# Patient Record
Sex: Female | Born: 1985 | Race: White | Hispanic: No | Marital: Married | Smoking: Current every day smoker
Health system: Southern US, Community
[De-identification: ages and names within clinical notes are randomized; demographics above are authoritative.]

## PROBLEM LIST (undated history)

## (undated) DIAGNOSIS — G43909 Migraine, unspecified, not intractable, without status migrainosus: Secondary | ICD-10-CM

## (undated) DIAGNOSIS — D693 Immune thrombocytopenic purpura: Secondary | ICD-10-CM

## (undated) DIAGNOSIS — F419 Anxiety disorder, unspecified: Secondary | ICD-10-CM

## (undated) DIAGNOSIS — T7840XA Allergy, unspecified, initial encounter: Secondary | ICD-10-CM

## (undated) DIAGNOSIS — F32A Depression, unspecified: Secondary | ICD-10-CM

## (undated) DIAGNOSIS — F329 Major depressive disorder, single episode, unspecified: Secondary | ICD-10-CM

## (undated) DIAGNOSIS — D689 Coagulation defect, unspecified: Secondary | ICD-10-CM

## (undated) DIAGNOSIS — Z87442 Personal history of urinary calculi: Secondary | ICD-10-CM

## (undated) DIAGNOSIS — K219 Gastro-esophageal reflux disease without esophagitis: Secondary | ICD-10-CM

## (undated) HISTORY — DX: Anxiety disorder, unspecified: F41.9

## (undated) HISTORY — DX: Depression, unspecified: F32.A

## (undated) HISTORY — DX: Allergy, unspecified, initial encounter: T78.40XA

## (undated) HISTORY — DX: Coagulation defect, unspecified: D68.9

## (undated) HISTORY — DX: Major depressive disorder, single episode, unspecified: F32.9

---

## 2005-09-07 ENCOUNTER — Ambulatory Visit: Payer: Self-pay | Admitting: Oncology

## 2005-09-08 ENCOUNTER — Inpatient Hospital Stay (HOSPITAL_COMMUNITY): Admission: EM | Admit: 2005-09-08 | Discharge: 2005-09-10 | Payer: Self-pay | Admitting: Oncology

## 2005-09-12 ENCOUNTER — Ambulatory Visit: Payer: Self-pay | Admitting: Oncology

## 2005-09-13 LAB — CBC WITH DIFFERENTIAL/PLATELET
BASO%: 0.2 % (ref 0.0–2.0)
EOS%: 0.2 % (ref 0.0–7.0)
Eosinophils Absolute: 0 10*3/uL (ref 0.0–0.5)
LYMPH%: 23.1 % (ref 14.0–48.0)
MONO#: 0.8 10*3/uL (ref 0.1–0.9)
MONO%: 4.2 % (ref 0.0–13.0)
NEUT%: 72.3 % (ref 39.6–76.8)
Platelets: 89 10*3/uL — ABNORMAL LOW (ref 145–400)
RBC: 4.71 10*6/uL (ref 3.70–5.32)
WBC: 18.9 10*3/uL — ABNORMAL HIGH (ref 3.9–10.0)
lymph#: 4.4 10*3/uL — ABNORMAL HIGH (ref 0.9–3.3)

## 2005-09-19 LAB — CBC WITH DIFFERENTIAL/PLATELET
EOS%: 0 % (ref 0.0–7.0)
Eosinophils Absolute: 0 10*3/uL (ref 0.0–0.5)
HCT: 43.1 % (ref 34.8–46.6)
HGB: 14.8 g/dL (ref 11.6–15.9)
MONO%: 2 % (ref 0.0–13.0)
NEUT#: 25.6 10*3/uL — ABNORMAL HIGH (ref 1.5–6.5)
Platelets: 224 10*3/uL (ref 145–400)
RBC: 4.78 10*6/uL (ref 3.70–5.32)
RDW: 11.9 % (ref 11.3–14.5)
WBC: 28.2 10*3/uL — ABNORMAL HIGH (ref 3.9–10.0)

## 2005-09-26 LAB — CBC WITH DIFFERENTIAL/PLATELET
Basophils Absolute: 0.1 10*3/uL (ref 0.0–0.1)
EOS%: 0.1 % (ref 0.0–7.0)
Eosinophils Absolute: 0 10*3/uL (ref 0.0–0.5)
HGB: 13.7 g/dL (ref 11.6–15.9)
LYMPH%: 8.6 % — ABNORMAL LOW (ref 14.0–48.0)
MONO#: 0.5 10*3/uL (ref 0.1–0.9)
MONO%: 2.1 % (ref 0.0–13.0)
WBC: 22.9 10*3/uL — ABNORMAL HIGH (ref 3.9–10.0)
lymph#: 2 10*3/uL (ref 0.9–3.3)

## 2005-10-03 LAB — URINALYSIS, MICROSCOPIC - CHCC
Bilirubin (Urine): NEGATIVE
Blood: NEGATIVE
Leukocyte Esterase: NEGATIVE
Nitrite: NEGATIVE
Protein: NEGATIVE mg/dL

## 2005-10-03 LAB — CBC WITH DIFFERENTIAL/PLATELET
Basophils Absolute: 0 10*3/uL (ref 0.0–0.1)
EOS%: 0.1 % (ref 0.0–7.0)
Eosinophils Absolute: 0 10*3/uL (ref 0.0–0.5)
HCT: 38.7 % (ref 34.8–46.6)
LYMPH%: 9 % — ABNORMAL LOW (ref 14.0–48.0)
NEUT#: 10.8 10*3/uL — ABNORMAL HIGH (ref 1.5–6.5)
RBC: 4.27 10*6/uL (ref 3.70–5.32)
WBC: 12 10*3/uL — ABNORMAL HIGH (ref 3.9–10.0)

## 2005-10-26 ENCOUNTER — Ambulatory Visit: Payer: Self-pay | Admitting: Oncology

## 2005-11-13 LAB — CBC WITH DIFFERENTIAL/PLATELET
BASO%: 0.8 % (ref 0.0–2.0)
Basophils Absolute: 0.1 10*3/uL (ref 0.0–0.1)
Eosinophils Absolute: 0.1 10*3/uL (ref 0.0–0.5)
HCT: 39 % (ref 34.8–46.6)
HGB: 13.6 g/dL (ref 11.6–15.9)
MCV: 91.3 fL (ref 81.0–101.0)
MONO%: 7.5 % (ref 0.0–13.0)
NEUT%: 58.2 % (ref 39.6–76.8)
RBC: 4.27 10*6/uL (ref 3.70–5.32)
WBC: 10.7 10*3/uL — ABNORMAL HIGH (ref 3.9–10.0)
lymph#: 3.5 10*3/uL — ABNORMAL HIGH (ref 0.9–3.3)

## 2005-11-27 LAB — CBC WITH DIFFERENTIAL/PLATELET
Basophils Absolute: 0.1 10*3/uL (ref 0.0–0.1)
EOS%: 0.4 % (ref 0.0–7.0)
Eosinophils Absolute: 0 10*3/uL (ref 0.0–0.5)
HGB: 14.1 g/dL (ref 11.6–15.9)
LYMPH%: 13.5 % — ABNORMAL LOW (ref 14.0–48.0)
MONO%: 3.8 % (ref 0.0–13.0)
WBC: 12.6 10*3/uL — ABNORMAL HIGH (ref 3.9–10.0)

## 2005-12-22 ENCOUNTER — Ambulatory Visit: Payer: Self-pay | Admitting: Oncology

## 2005-12-26 LAB — CBC WITH DIFFERENTIAL/PLATELET
BASO%: 1.2 % (ref 0.0–2.0)
Basophils Absolute: 0.1 10*3/uL (ref 0.0–0.1)
EOS%: 1.8 % (ref 0.0–7.0)
HCT: 40.1 % (ref 34.8–46.6)
HGB: 13.7 g/dL (ref 11.6–15.9)
LYMPH%: 28.7 % (ref 14.0–48.0)
MCH: 31.2 pg (ref 26.0–34.0)
MONO%: 7.4 % (ref 0.0–13.0)
NEUT#: 4.7 10*3/uL (ref 1.5–6.5)
Platelets: 334 10*3/uL (ref 145–400)

## 2006-01-22 LAB — CBC WITH DIFFERENTIAL/PLATELET
BASO%: 1 % (ref 0.0–2.0)
Basophils Absolute: 0.1 10*3/uL (ref 0.0–0.1)
EOS%: 3.6 % (ref 0.0–7.0)
Eosinophils Absolute: 0.2 10*3/uL (ref 0.0–0.5)
HCT: 39.7 % (ref 34.8–46.6)
HGB: 13.5 g/dL (ref 11.6–15.9)
LYMPH%: 34.2 % (ref 14.0–48.0)
MCH: 30.9 pg (ref 26.0–34.0)
MCHC: 34.1 g/dL (ref 32.0–36.0)
MCV: 90.6 fL (ref 81.0–101.0)
MONO#: 0.5 10*3/uL (ref 0.1–0.9)
MONO%: 9.2 % (ref 0.0–13.0)
NEUT#: 3 10*3/uL (ref 1.5–6.5)
NEUT%: 52 % (ref 39.6–76.8)
Platelets: 246 10*3/uL (ref 145–400)
RBC: 4.38 10*6/uL (ref 3.70–5.32)
RDW: 10.8 % — ABNORMAL LOW (ref 11.3–14.5)
WBC: 5.8 10*3/uL (ref 3.9–10.0)
lymph#: 2 10*3/uL (ref 0.9–3.3)

## 2006-02-14 ENCOUNTER — Ambulatory Visit: Payer: Self-pay | Admitting: Oncology

## 2006-02-19 LAB — CBC WITH DIFFERENTIAL/PLATELET
Eosinophils Absolute: 0 10*3/uL (ref 0.0–0.5)
HCT: 40.8 % (ref 34.8–46.6)
MCH: 31.8 pg (ref 26.0–34.0)
MONO#: 0.4 10*3/uL (ref 0.1–0.9)
MONO%: 4.4 % (ref 0.0–13.0)
NEUT#: 7.1 10*3/uL — ABNORMAL HIGH (ref 1.5–6.5)
Platelets: 236 10*3/uL (ref 145–400)
RBC: 4.51 10*6/uL (ref 3.70–5.32)

## 2006-05-17 ENCOUNTER — Ambulatory Visit: Payer: Self-pay | Admitting: Oncology

## 2006-05-21 LAB — CBC WITH DIFFERENTIAL/PLATELET
Basophils Absolute: 0.1 10*3/uL (ref 0.0–0.1)
EOS%: 1.6 % (ref 0.0–7.0)
MCHC: 34.6 g/dL (ref 32.0–36.0)
MCV: 90.8 fL (ref 81.0–101.0)
MONO#: 0.6 10*3/uL (ref 0.1–0.9)
Platelets: 202 10*3/uL (ref 145–400)
RBC: 4.18 10*6/uL (ref 3.70–5.32)
RDW: 10.9 % — ABNORMAL LOW (ref 11.3–14.5)
lymph#: 1.9 10*3/uL (ref 0.9–3.3)

## 2006-08-16 ENCOUNTER — Ambulatory Visit: Payer: Self-pay | Admitting: Oncology

## 2006-08-20 LAB — CBC WITH DIFFERENTIAL/PLATELET
Basophils Absolute: 0 10*3/uL (ref 0.0–0.1)
HCT: 35.3 % (ref 34.8–46.6)
MCH: 31.7 pg (ref 26.0–34.0)
MCHC: 35.3 g/dL (ref 32.0–36.0)
MCV: 89.9 fL (ref 81.0–101.0)
MONO#: 0.4 10*3/uL (ref 0.1–0.9)
NEUT%: 70.9 % (ref 39.6–76.8)
RBC: 3.93 10*6/uL (ref 3.70–5.32)

## 2007-01-17 ENCOUNTER — Ambulatory Visit: Payer: Self-pay | Admitting: Oncology

## 2007-01-21 LAB — CBC WITH DIFFERENTIAL/PLATELET
EOS%: 1 % (ref 0.0–7.0)
Eosinophils Absolute: 0.1 10*3/uL (ref 0.0–0.5)
MCH: 31.4 pg (ref 26.0–34.0)
MCHC: 34.8 g/dL (ref 32.0–36.0)
MONO#: 0.4 10*3/uL (ref 0.1–0.9)
NEUT#: 3.1 10*3/uL (ref 1.5–6.5)
NEUT%: 58.4 % (ref 39.6–76.8)
Platelets: 96 10*3/uL — ABNORMAL LOW (ref 145–400)
WBC: 5.4 10*3/uL (ref 3.9–10.0)
lymph#: 1.8 10*3/uL (ref 0.9–3.3)

## 2007-02-21 LAB — CBC WITH DIFFERENTIAL/PLATELET
BASO%: 1.2 % (ref 0.0–2.0)
EOS%: 1.3 % (ref 0.0–7.0)
Eosinophils Absolute: 0.1 10*3/uL (ref 0.0–0.5)
HCT: 37.6 % (ref 34.8–46.6)
HGB: 13.2 g/dL (ref 11.6–15.9)
Platelets: 158 10*3/uL (ref 145–400)
RBC: 4.24 10*6/uL (ref 3.70–5.32)
RDW: 9.9 % — ABNORMAL LOW (ref 11.3–14.5)
WBC: 5 10*3/uL (ref 3.9–10.0)
lymph#: 1.6 10*3/uL (ref 0.9–3.3)

## 2007-04-17 ENCOUNTER — Ambulatory Visit: Payer: Self-pay | Admitting: Oncology

## 2007-04-22 LAB — CBC WITH DIFFERENTIAL/PLATELET
BASO%: 1 % (ref 0.0–2.0)
Basophils Absolute: 0.1 10*3/uL (ref 0.0–0.1)
EOS%: 0.2 % (ref 0.0–7.0)
HCT: 39.2 % (ref 34.8–46.6)
LYMPH%: 26.2 % (ref 14.0–48.0)
MCH: 31.8 pg (ref 26.0–34.0)
MONO#: 0.5 10*3/uL (ref 0.1–0.9)
MONO%: 7.3 % (ref 0.0–13.0)
RBC: 4.31 10*6/uL (ref 3.70–5.32)
WBC: 7.4 10*3/uL (ref 3.9–10.0)
lymph#: 1.9 10*3/uL (ref 0.9–3.3)

## 2007-07-18 ENCOUNTER — Ambulatory Visit: Payer: Self-pay | Admitting: Oncology

## 2007-07-22 LAB — CBC WITH DIFFERENTIAL/PLATELET
BASO%: 0.4 % (ref 0.0–2.0)
Basophils Absolute: 0 10*3/uL (ref 0.0–0.1)
HCT: 36.7 % (ref 34.8–46.6)
HGB: 12.7 g/dL (ref 11.6–15.9)
LYMPH%: 19.4 % (ref 14.0–48.0)
MCV: 89.5 fL (ref 81.0–101.0)
MONO#: 0.5 10*3/uL (ref 0.1–0.9)
NEUT#: 6.6 10*3/uL — ABNORMAL HIGH (ref 1.5–6.5)
NEUT%: 73.1 % (ref 39.6–76.8)
RBC: 4.1 10*6/uL (ref 3.70–5.32)
RDW: 12.5 % (ref 11.3–14.5)
WBC: 9 10*3/uL (ref 3.9–10.0)

## 2007-10-18 ENCOUNTER — Ambulatory Visit: Payer: Self-pay | Admitting: Oncology

## 2007-10-22 LAB — CBC WITH DIFFERENTIAL/PLATELET
EOS%: 1.4 % (ref 0.0–7.0)
HCT: 41.2 % (ref 34.8–46.6)
HGB: 13.5 g/dL (ref 11.6–15.9)
MCH: 29.9 pg (ref 26.0–34.0)
MONO%: 7.9 % (ref 0.0–13.0)
RDW: 12.3 % (ref 11.3–14.5)
WBC: 6.8 10*3/uL (ref 3.9–10.0)

## 2008-04-17 ENCOUNTER — Ambulatory Visit: Payer: Self-pay | Admitting: Oncology

## 2008-04-21 LAB — CBC WITH DIFFERENTIAL/PLATELET
BASO%: 0.4 % (ref 0.0–2.0)
Basophils Absolute: 0 10*3/uL (ref 0.0–0.1)
Eosinophils Absolute: 0.1 10*3/uL (ref 0.0–0.5)
HCT: 38.4 % (ref 34.8–46.6)
HGB: 13.1 g/dL (ref 11.6–15.9)
LYMPH%: 21.7 % (ref 14.0–49.7)
MCV: 89.7 fL (ref 79.5–101.0)
MONO#: 0.6 10*3/uL (ref 0.1–0.9)
MONO%: 8.5 % (ref 0.0–14.0)
NEUT#: 5.1 10*3/uL (ref 1.5–6.5)
NEUT%: 67.8 % (ref 38.4–76.8)
WBC: 7.5 10*3/uL (ref 3.9–10.3)

## 2008-10-12 ENCOUNTER — Emergency Department (HOSPITAL_COMMUNITY): Admission: EM | Admit: 2008-10-12 | Discharge: 2008-10-12 | Payer: Self-pay | Admitting: Emergency Medicine

## 2008-10-16 ENCOUNTER — Ambulatory Visit: Payer: Self-pay | Admitting: Oncology

## 2008-10-27 LAB — CBC WITH DIFFERENTIAL/PLATELET
BASO%: 0.7 % (ref 0.0–2.0)
Eosinophils Absolute: 0.1 10*3/uL (ref 0.0–0.5)
MCH: 30.3 pg (ref 25.1–34.0)
MCHC: 34.1 g/dL (ref 31.5–36.0)
MCV: 88.8 fL (ref 79.5–101.0)
MONO#: 0.7 10*3/uL (ref 0.1–0.9)
NEUT%: 55.2 % (ref 38.4–76.8)

## 2009-02-09 ENCOUNTER — Ambulatory Visit: Payer: Self-pay | Admitting: Oncology

## 2009-02-09 LAB — CBC WITH DIFFERENTIAL/PLATELET
BASO%: 0.5 % (ref 0.0–2.0)
Basophils Absolute: 0 10*3/uL (ref 0.0–0.1)
HCT: 39.4 % (ref 34.8–46.6)
HGB: 13.2 g/dL (ref 11.6–15.9)
MCHC: 33.6 g/dL (ref 31.5–36.0)
MONO#: 0.5 10*3/uL (ref 0.1–0.9)
Platelets: 237 10*3/uL (ref 145–400)

## 2009-03-22 ENCOUNTER — Emergency Department (HOSPITAL_COMMUNITY): Admission: EM | Admit: 2009-03-22 | Discharge: 2009-03-22 | Payer: Self-pay | Admitting: Emergency Medicine

## 2009-04-16 ENCOUNTER — Ambulatory Visit: Payer: Self-pay | Admitting: Oncology

## 2010-04-03 LAB — URINALYSIS, ROUTINE W REFLEX MICROSCOPIC
Bilirubin Urine: NEGATIVE
Glucose, UA: NEGATIVE mg/dL
Nitrite: NEGATIVE
Protein, ur: 30 mg/dL — AB
Specific Gravity, Urine: 1.027 (ref 1.005–1.030)
pH: 6 (ref 5.0–8.0)

## 2010-04-03 LAB — URINE MICROSCOPIC-ADD ON

## 2010-04-03 LAB — POCT PREGNANCY, URINE: Preg Test, Ur: NEGATIVE

## 2010-05-27 NOTE — Discharge Summary (Signed)
NAME:  Misty Bauer, FITZWATER NO.:  [id number]   MEDICAL RECORD NO.:  [id number]          PATIENT TYPE:  INP   LOCATION:  1305                         FACILITY:  [facility]   PHYSICIAN:  Leighton Roach. Truett Perna, M.D. DATE OF BIRTH:  17-Mar-1985   DATE OF ADMISSION:  09/08/2005  DATE OF DISCHARGE:  09/10/2005                                 DISCHARGE SUMMARY   DISCHARGE DIAGNOSES:  1. 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  2. Ecchymoses, stable.   CONDITION ON DISCHARGE:  Stable.   CONSULTATIONS:  None.   PROCEDURE:  None.   HISTORY OF PRESENT ILLNESS:  [name] was admitted to the hospital with  increased bruising over the past four months.  She had noted gum bleeding  when she brushed her teeth and had noted a few nosebleeds.  The nosebleeds  had stopped with pressure.  She also reports sporadic rectal bleeding over  the past year following bowel movements.  She was diagnosed with a torn  sphincter by a physician in [place].  She does not have any consistent  rectal bleeding.  The patient did present to the [employer] [facility] earlier on the day of admission.  A CBC was remarkable for a  platelet count of less than 10,000.  A repeat CBC later in the day again  revealed severe thrombocytopenia with a platelet count of 2,000.  The  patient was sent to the [facility] emergency room for further evaluation.  The patient was admitted for further management of her thrombocytopenia.   HOSPITAL COURSE:  The patient was noted to have a platelet count of 6,000 on  admission.  The patient was started on intravenous Solu-Medrol. She was not  noted to have any evidence of bleeding on admission.  On the morning of  September 08, 2005 her platelet count was noted to be 5,000 and intravenous  Solu-Medrol was continued.  Again, she was not noted to have any further  bleeding.  On the morning of September 09, 2005 her platelet count did  increase to 13,000.   There was no bleeding.  The steroids were continued.  No new bruising.  Her platelet count did increase to 28,000 on the morning  of September 10, 2005.  There was no bleeding at that time.  No new bruising.  Her vital signs were stable.  Her physical examination was unremarkable.  The patient was seen by Dr. Drue Second  on that date and felt to be stable  for discharge.   DISCHARGE MEDICATIONS:  1. Decadron 40 mg daily.  2. Ortho-Novum as at home.  3. Protonix 40 mg daily.  4. Xanax 1 mg at bedtime as needed for sleep.   DISCHARGE INSTRUCTIONS:  The patient is to followup with Dr. Mancel Bale.  She is to call on September 12, 2005 at [phone] to schedule that  appointment.  The patient was also given a prescription for CBC with diff to  be done at the cancer center on September 12, 2005.  She will call the  morning of September 12, 2005 to schedule that  appointment.  The  patient has no restrictions on diet.  She is to increase  her activity slowly.  The patient is to followup with Dr. Truett Perna before  she returns to school.  She is to call [phone] if she has any bleeding or  increased bruising in the interim.  The patient states understanding of all  the instructions.      Myrtis Ser, NP    ______________________________  Leighton Roach Truett Perna, M.D.    KRC/MEDQ  D:  09/10/2005  T:  09/11/2005  Job:  [id number]

## 2010-05-27 NOTE — Consult Note (Signed)
NAME:  Misty, Bauer NO.:  [id number]   MEDICAL RECORD NO.:  [id number]          PATIENT TYPE:  EMS   LOCATION:  MAJO                         FACILITY:  [facility]   PHYSICIAN:  Leighton Roach. Truett Perna, M.D. DATE OF BIRTH:  05-05-1985   DATE OF CONSULTATION:  DATE OF DISCHARGE:                                   CONSULTATION   HEMATOLOGY CONSULTATION   PATIENT IDENTIFICATION:  Misty Bauer is a 25 year old admitted for  evaluation and treatment of severe thrombocytopenia.   HISTORY OF PRESENT ILLNESS:  Misty Bauer reports increased bruising for  approximately the past four months. She has noted gum bleeding when she  brushes her teeth, and she has had a few nosebleeds. The nose bleeds have  stopped with pressure. She reports sporadic rectal bleeding over the past  year following bowel movements. She was diagnosed with a torn sphincter by  a physician in [place]. She does not have consistent rectal bleeding.   She presented to the [facility] earlier today. A CBC was  remarkable for platelet count of less than 10,000. A repeat CBC later in the  day again revealed severe thrombocytopenia with a platelet count 2000. She  was referred to the [facility] emergency room for further evaluation.   PAST MEDICAL HISTORY:  1. Torn rectal sphincter.  2. Kidney stone in April 2007.  3. Iliotibial band syndrome at the left leg for the past few years.  4. Upper respiratory infection requiring hospital admission in [place]      in 2003.   PAST SURGICAL HISTORY:  None.   ALLERGIES:  NO KNOWN DRUG ALLERGIES.   CURRENT MEDICATIONS:  1. Multivitamin.  2. Naprosyn.  3. Tylenol.  4. Ortho-Novum 7/7/7.   FAMILY HISTORY:  Her maternal grandmother has arthritis.   SOCIAL HISTORY:  She lives with her father in [place]. She works in [employer]. She is a Consulting civil engineer at [employer]. She smokes cigarettes. She drinks  alcohol occasionally. She is not a consistent alcohol user.  She has no  transfusion history. She denies risk factors for HIV and hepatitis.   REVIEW OF SYSTEMS:  No fever. She reports malaise. RESPIRATORY:  Negative.  CARDIAC:  Negative. GENITOURINARY:  Negative. GASTROINTESTINAL:  Negative  aside from the intermittent bleeding with bowel movements. MUSCULOSKELETAL:  She has chronic pain at the left knee (for years). She fell recently and  developed a knot at the left parietal skull. NEUROLOGIC:  She has chronic  headaches.   INFECTIOUS DISEASE:  No recent infection.   PHYSICAL EXAMINATION:  VITAL SIGNS:  Blood pressure 133/90, pulse 92,  temperature 99.2, oxygen saturation 100% on room air. HEENT:  Oropharynx  without thrush, ulcers or bleeding. LUNGS:  Clear. No respiratory distress.  CARDIAC:  Regular rhythm. ABDOMEN:  No hepatosplenomegaly, nontender.  EXTREMITIES:  No edema. LYMPH NODES:  No palpable cervical, clavicular,  axillary, or inguinal lymph nodes. SKIN:  There are scattered ecchymoses  over the extremities. No petechiae. There is an ecchymosis (?small hematoma)  over the scalp posterior to the left ear. NEUROLOGIC:  She is alert and  oriented. Motor exam  appears grossly intact.   LABORATORY DATA:  Hemoglobin 12.5, platelets 6000, white count 9.8, ANC 7,  absolute lymphocyte count 2.1. Labs from [facility] on August 30, BUN 14,  creatinine 0.68, bilirubin 0.4, alkaline phosphatase 61. PT 13.8, PTT 30.  TSH 0.608.   Review of peripheral blood smear:  The platelets are markedly decreased in  number. There are occasional large/giant platelet forms. The majority of the  white cells are mature-appearing neutrophils and lymphocytes. There are a  few band forms. I saw a few 5-lobed neutrophils. The red cell morphology is  generally unremarkable. I saw a rare teardrop form.   IMPRESSION:  1. Severe thrombocytopenia.  2. Multiple ecchymoses secondary to #1.  3. History of a kidney stone April 2007.  4. Chronic left knee pain.   Ms.  Misty Bauer presents with spontaneous bruising secondary to severe  thrombocytopenia. The thrombocytopenia is most likely related to ITP. There  is no clinical evidence for an associated condition such as a hematopoietic  malignancy, infection or collagen vascular disease.   PLAN:  1. Admit for observation and initiation of steroid therapy.  2. Check ANA, rheumatoid factor, HIV, and vitamin B12 level.   She will be admitted to [facility].           ______________________________  Leighton Roach. Truett Perna, M.D.     GBS/MEDQ  D:  09/07/2005  T:  09/08/2005  Job:  [id number]

## 2012-01-10 HISTORY — PX: WISDOM TOOTH EXTRACTION: SHX21

## 2014-05-20 ENCOUNTER — Encounter (HOSPITAL_COMMUNITY): Payer: Self-pay | Admitting: *Deleted

## 2014-05-20 ENCOUNTER — Emergency Department (INDEPENDENT_AMBULATORY_CARE_PROVIDER_SITE_OTHER)
Admission: EM | Admit: 2014-05-20 | Discharge: 2014-05-20 | Disposition: A | Discharge status: Home / Self Care | Payer: 59 | Source: Home / Self Care | Attending: Emergency Medicine | Admitting: Emergency Medicine

## 2014-05-20 DIAGNOSIS — K219 Gastro-esophageal reflux disease without esophagitis: Secondary | ICD-10-CM | POA: Diagnosis not present

## 2014-05-20 HISTORY — DX: Gastro-esophageal reflux disease without esophagitis: K21.9

## 2014-05-20 HISTORY — DX: Immune thrombocytopenic purpura: D69.3

## 2014-05-20 HISTORY — DX: Migraine, unspecified, not intractable, without status migrainosus: G43.909

## 2014-05-20 MED ORDER — SUCRALFATE 1 GM/10ML PO SUSP: 1.0000 g | Freq: Three times a day (TID) | ORAL | Status: DC

## 2014-05-20 MED ORDER — PANTOPRAZOLE SODIUM 40 MG PO TBEC: 40.0000 mg | DELAYED_RELEASE_TABLET | Freq: Every day | ORAL | Status: DC

## 2014-05-20 NOTE — ED Notes (Signed)
Pt  Reports  Symptoms  Of  Cough    sorethroat           Coughing   Up  Blood  This  Am         History  Of  GERD       Pt  Reports  As  Well    That  [facility]     And  Had  A  Rapid  Strep  Done  Which  She  Reports  Was  Negative    -  Pt  Ambulated  To  Room   With a  Steady  Fluid gait       Pt is  Awake  And  Alert  And  Oriented  She  Is  Sitting  Upright on the  Exam table  Speaking in  Complete  sentances

## 2014-05-20 NOTE — Discharge Instructions (Signed)
Your symptoms are likely coming from uncontrolled heartburn. Take Protonix daily for the next 3 weeks. After that you can gradually stopped taking it. Take Carafate 4 times a day for the next 2 weeks. If you start having bloody vomit, dark and tarry stools or bright red blood in your stool, or develops severe abdominal pain, please go to the emergency room.

## 2014-05-20 NOTE — ED Provider Notes (Signed)
CSN: [id number]     Arrival date & time 05/20/14  [id number] History   First MD Initiated Contact with Patient 05/20/14 1013     Chief Complaint  Patient presents with  . Cough   (Consider location/radiation/quality/duration/timing/severity/associated sxs/prior Treatment) HPI  She is a 29 year old woman here for evaluation of cough. [facility]. She woke up this morning with a sore throat. She coughed up some pink tinged mucus, then bright red bloody mucus, and now back to pink tinged mucus. She reports some mild rhinorrhea. She does have a cough. This is worse at night and in the morning. She does report epigastric burning pain that will radiate into her chest. She does have a history of reflux, but has not been on any medication for several years. She states for the last 5-6 months her heartburn has been getting worse. A rapid strep was negative at [facility].  She denies any fevers, vomiting, diarrhea, melena, blood in stool.  Past Medical History  Diagnosis Date  . GERD (gastroesophageal reflux disease)   . ITP (idiopathic thrombocytopenic purpura)   . Migraine    History reviewed. No pertinent past surgical history. History reviewed. No pertinent family history. History  Substance Use Topics  . Smoking status: Current Every Day Smoker  . Smokeless tobacco: Not on file  . Alcohol Use: Yes   OB History    No data available     Review of Systems As in history of present illness Allergies  Review of patient's allergies indicates no known allergies.  Home Medications   Prior to Admission medications   Medication Sig Start Date End Date Taking? Authorizing Provider  fluticasone (FLONASE) 50 MCG/ACT nasal spray Place into both nostrils daily.   Yes Historical Provider, MD  pantoprazole (PROTONIX) 40 MG tablet Take 1 tablet (40 mg total) by mouth daily. 05/20/14   Charm RingsErin J Maddy Graham, MD  sucralfate (CARAFATE) 1 GM/10ML suspension Take 10 mLs (1 g total) by mouth 4  (four) times daily -  with meals and at bedtime. For 2 weeks. 05/20/14   Charm RingsErin J Haevyn Ury, MD   BP 134/78 mmHg  Pulse 72  Temp(Src) 98.6 F (37 C) (Oral)  Resp 14  SpO2 100% Physical Exam  Constitutional: She is oriented to person, place, and time. She appears well-developed and well-nourished. No distress.  HENT:  Nose: Nose normal.  Mouth/Throat: Oropharynx is clear and moist. No oropharyngeal exudate.  Neck: Neck supple.  Cardiovascular: Normal rate, regular rhythm and normal heart sounds.   No murmur heard. Pulmonary/Chest: Effort normal and breath sounds normal. No respiratory distress. She has no wheezes. She has no rales.  Abdominal: Soft. Bowel sounds are normal. She exhibits no distension. There is tenderness (epigastric). There is no rebound and no guarding.  Lymphadenopathy:    She has no cervical adenopathy.  Neurological: She is alert and oriented to person, place, and time.    ED Course  Procedures (including critical care time) Labs Review Labs Reviewed - No data to display  Imaging Review No results found.   MDM   1. Gastroesophageal reflux disease, esophagitis presence not specified    Symptoms are likely related to uncontrolled reflux. We'll start protonix and Carafate. Return precautions reviewed as in after visit summary.   Charm RingsErin J Shady Padron, MD 05/20/14 1101

## 2014-08-05 ENCOUNTER — Ambulatory Visit (INDEPENDENT_AMBULATORY_CARE_PROVIDER_SITE_OTHER): Payer: 59 | Admitting: Emergency Medicine

## 2014-08-05 VITALS — BP 118/82 | HR 72 | Temp 98.4°F | Resp 18 | Ht 64.0 in | Wt 94.6 lb

## 2014-08-05 DIAGNOSIS — G43009 Migraine without aura, not intractable, without status migrainosus: Secondary | ICD-10-CM | POA: Diagnosis not present

## 2014-08-05 MED ORDER — KETOROLAC TROMETHAMINE 30 MG/ML IJ SOLN
30.0000 mg | Freq: Once | INTRAMUSCULAR | Status: AC
  Administered 2014-08-05: 30 mg via INTRAMUSCULAR

## 2014-08-05 MED ORDER — BUTALBITAL-ASPIRIN-CAFFEINE 50-325-40 MG PO CAPS: 1.0000 | ORAL_CAPSULE | Freq: Four times a day (QID) | ORAL | Status: DC | PRN

## 2014-08-05 NOTE — Progress Notes (Signed)
This chart was scribed for Misty Queen, MD by Leandra Kern, Medical Scribe. This patient was seen in Room 12 and the patient's care was started at 1:30 PM.   Chief Complaint:  Chief Complaint  Patient presents with  . Migraine    x monday  . Depression    triage screening    HPI: Misty Bauer is a 29 y.o. female who reports to [facility] today complaining of severe migraines, onset 2 days ago.  Pt notes that she presents with symptoms of headache, tension in the neck, and photophobia. She reports that she usually takes protonix for the symptoms, however she states the medication has not been giving her much relief for the past few episodes. Pt reports that her migraines episodes have reduced in frequency over the last two years after taking her wisdom teeth out. She reports that she now gets them about once or twice a month. Pt notes that she has taken shots previously. Pt notes that she just got off birth control pills for hopes of becoming pregnant. Pt endorses that she is not pregnant, her last menstrual period was 2 weeks ago.   Pt reports that her depression symptoms have been going on for a couple of years, she notes that her PCP did put her on an anti-depressant medication which she was compliant with for a while, however she states that she started experiencing side effects therefore she stopped taking it. She notes no current concerns about it.  Pt works at [employer].   Past Medical History  Diagnosis Date  . GERD (gastroesophageal reflux disease)   . ITP (idiopathic thrombocytopenic purpura)   . Migraine   . Allergy   . Anxiety   . Clotting disorder   . Depression    History reviewed. No pertinent past surgical history. History   Social History  . Marital Status: Single    Spouse Name: N/A  . Number of Children: N/A  . Years of Education: N/A   Social History Main Topics  . Smoking status: Current Every Day Smoker  . Smokeless tobacco: Not on file  .  Alcohol Use: Yes  . Drug Use: Not on file  . Sexual Activity: Not on file   Other Topics Concern  . None   Social History Narrative   Family History  Problem Relation Age of Onset  . Hyperlipidemia Maternal Grandmother    Allergies  Allergen Reactions  . Penicillins Hives   Prior to Admission medications   Medication Sig Start Date End Date Taking? Authorizing Provider  fluticasone (FLONASE) 50 MCG/ACT nasal spray Place into both nostrils daily.   Yes Historical Provider, MD  norethindrone-ethinyl estradiol 1/35 (ORTHO-NOVUM, NORTREL,CYCLAFEM) tablet Take 1 tablet by mouth daily.   Yes Historical Provider, MD  pantoprazole (PROTONIX) 40 MG tablet Take 1 tablet (40 mg total) by mouth daily. 05/20/14  Yes Melony Overly, MD  sucralfate (CARAFATE) 1 GM/10ML suspension Take 10 mLs (1 g total) by mouth 4 (four) times daily -  with meals and at bedtime. For 2 weeks. Patient not taking: Reported on 08/05/2014 05/20/14   Melony Overly, MD     ROS: The patient has headache, tension in the neck, photophobia, and dysphoric mood.  All other systems have been reviewed and were otherwise negative with the exception of those mentioned in the HPI and as above.    PHYSICAL EXAM: Filed Vitals:   08/05/14 1231  BP: 118/82  Pulse: 72  Temp: 98.4 F (  36.9 C)  Resp: 18   Body mass index is 16.23 kg/(m^2).   General: Alert, no acute distress HEENT:  Normocephalic, atraumatic, oropharynx patent. Eye: Juliette Mangle [facility] Cardiovascular:  Regular rate and rhythm, no rubs murmurs or gallops.  No Carotid bruits, radial pulse intact. No pedal edema.  Respiratory: Clear to auscultation bilaterally.  No wheezes, rales, or rhonchi.  No cyanosis, no use of accessory musculature Abdominal: No organomegaly, abdomen is soft and non-tender, positive bowel sounds.  No masses. Musculoskeletal: Gait intact. No edema, tenderness Skin: No rashes. Neurologic: Facial musculature symmetric. Psychiatric: Patient acts  appropriately throughout our interaction. Lymphatic: No cervical or submandibular lymphadenopathy Genitourinary/Anorectal: No acute findings    LABS: Results for orders placed or performed during the hospital encounter of 03/22/09  Urinalysis, Routine w reflex microscopic  Result Value Ref Range   Color, Urine AMBER BIOCHEMICALS MAY BE AFFECTED BY COLOR (A) YELLOW   APPearance TURBID (A) CLEAR   Specific Gravity, Urine 1.027 1.005 - 1.030   pH 6.0 5.0 - 8.0   Glucose, UA NEGATIVE NEGATIVE mg/dL   Hgb urine dipstick MODERATE (A) NEGATIVE   Bilirubin Urine NEGATIVE NEGATIVE   Ketones, ur TRACE (A) NEGATIVE mg/dL   Protein, ur 30 (A) NEGATIVE mg/dL   Urobilinogen, UA 0.2 0.0 - 1.0 mg/dL   Nitrite NEGATIVE NEGATIVE   Leukocytes, UA SMALL (A) NEGATIVE  Urine microscopic-add on  Result Value Ref Range   Squamous Epithelial / LPF MANY (A) RARE   WBC, UA 7-10 <3 WBC/hpf   RBC / HPF 7-10 <3 RBC/hpf   Bacteria, UA FEW (A) RARE   Urine-Other MUCOUS PRESENT   Pregnancy, urine POC  Result Value Ref Range   Preg Test, Ur      NEGATIVE        THE SENSITIVITY OF THIS METHODOLOGY IS >24 mIU/mL     EKG/XRAY:   Primary read interpreted by Dr. Everlene Farrier at [facility].   ASSESSMENT/PLAN: She is currently off of her birth control pills and contemplating pregnancy. Referral made to neurology to get help with her migraines which are becoming worse. I did give her a limited number of Fioricet to take for breakthrough headaches. I did not prescribe Imitrex.I personally performed the services described in this documentation, which was scribed in my presence. The recorded information has been reviewed and is accurate. I did give HER-2 days off work. Her headache was 4 out of 10 when she left it was 8 out of 10 on arrival.  Nena Jordan, MD    Gross sideeffects, risk and benefits, and alternatives of medications d/w patient. Patient is aware that all medications have potential sideeffects and we are unable to  predict every sideeffect or drug-drug interaction that may occur.  Misty Queen MD 08/05/2014 1:29 PM

## 2014-08-05 NOTE — Patient Instructions (Signed)

## 2014-09-30 ENCOUNTER — Ambulatory Visit: Payer: 59 | Admitting: Neurology

## 2014-10-09 ENCOUNTER — Encounter: Payer: Self-pay | Admitting: Neurology

## 2014-10-09 ENCOUNTER — Ambulatory Visit (INDEPENDENT_AMBULATORY_CARE_PROVIDER_SITE_OTHER): Payer: 59 | Admitting: Neurology

## 2014-10-09 VITALS — BP 120/72 | HR 73 | Resp 14 | Ht 62.5 in | Wt 93.0 lb

## 2014-10-09 DIAGNOSIS — R51 Headache: Secondary | ICD-10-CM

## 2014-10-09 DIAGNOSIS — R519 Headache, unspecified: Secondary | ICD-10-CM | POA: Insufficient documentation

## 2014-10-09 DIAGNOSIS — G43109 Migraine with aura, not intractable, without status migrainosus: Secondary | ICD-10-CM | POA: Diagnosis not present

## 2014-10-09 MED ORDER — ZOLMITRIPTAN 5 MG NA SOLN: 1.0000 | NASAL | Status: DC | PRN

## 2014-10-09 MED ORDER — NORTRIPTYLINE HCL 10 MG PO CAPS: 10.0000 mg | ORAL_CAPSULE | Freq: Every day | ORAL | Status: DC

## 2014-10-09 NOTE — Patient Instructions (Addendum)
1. Schedule MRI brain without contrast for worsening headaches 2. Start nortriptyline : Take 1 capsule at night 3. Try Zomig nasal spray at onset of migraine. Let us know if helpful and we will call in refills 4. Minimize Excedrin, goody powder, Ibuprofen, or Tylenol to only 2-3 times a week to avoid rebound headaches 5. Keep a calendar of your headaches 6. Follow-up in 3 months  7. YOU HAVE BEEN SCHEDULED AT [facility] FOR AN MRI OF THE BRAIN ON 10/15/14.  PLEASE ARRIVE AT 2:30.    [address], [place] [address] [phone]

## 2014-10-09 NOTE — Progress Notes (Signed)
NEUROLOGY CONSULTATION NOTE  Misty Bauer MRN: [id number] DOB: 1985/06/14  Referring provider: Dr. Lesle Chris Primary care provider: Dr. Lesle Chris  Reason for consult:  migraines  Dear Dr Cleta Alberts:  Thank you for your kind referral of Misty Bauer for consultation of the above symptoms. Although her history is well known to you, please allow me to reiterate it for the purpose of our medical record. Records and images were personally reviewed where available.  HISTORY OF PRESENT ILLNESS: This is a pleasant 29 year old left-handed woman with a strong family history of migraines (mother, 2 maternal aunts, 2 sisters) and personal history of migraines since age 41 or 49, that have worsening over the past 4-5 years. She reports that since 2010 or 2011, she has been having a constant daily low grade 1 to 2 over 10 headache. She has migraines 1-2 times a month, starting at the base of her neck, then radiating up diffusely, with pressure-like 10/10 pain with associated nausea, photo and phonophobia, as well as significant sensitivity to smells. She usually has visual changes prior to migraines, such as seeing spots, or her vision briefly blackening then returning to normal. The last time she vomited from migraines was 2 years ago. She took Imitrex and Fioricet in the past, which did not help. She has been taking Excedrin on a near daily basis (around 6 or 7 doses a week) for the past couple of years. This has helped take the edge off. She denies any dizziness, diplopia, focal numbness/tingling/weakness, dysarthria/dysphagia, bowel/bladder dysfunction. She has constant high-pitched tinnitus. Lying down yesterday seemed to make the headache worse. Poor sleep is also a trigger, she usually gets 5-6 hours of sleep on average. She denies taking any headache preventative medication in the past. She denies any prior brain imaging studies.  She stopped birth control and has had only 2 menstrual periods,  both times seemed to have had more migraines. They are trying to get pregnant.   PAST MEDICAL HISTORY: Past Medical History  Diagnosis Date  . GERD (gastroesophageal reflux disease)   . ITP (idiopathic thrombocytopenic purpura)   . Migraine   . Allergy   . Anxiety   . Clotting disorder   . Depression     PAST SURGICAL HISTORY: Past Surgical History  Procedure Laterality Date  . Wisdom tooth extraction  2014    MEDICATIONS: Current Outpatient Prescriptions on File Prior to Visit  Medication Sig Dispense Refill  . butalbital-aspirin-caffeine (FIORINAL) 50-325-40 MG per capsule Take 1 capsule by mouth every 6 (six) hours as needed for headache. (Patient not taking: Reported on 10/09/2014) 20 capsule 0  . fluticasone (FLONASE) 50 MCG/ACT nasal spray Place into both nostrils daily.    . norethindrone-ethinyl estradiol 1/35 (ORTHO-NOVUM, NORTREL,CYCLAFEM) tablet Take 1 tablet by mouth daily.    . pantoprazole (PROTONIX) 40 MG tablet Take 1 tablet (40 mg total) by mouth daily. (Patient not taking: Reported on 10/09/2014) 30 tablet 1  . sucralfate (CARAFATE) 1 GM/10ML suspension Take 10 mLs (1 g total) by mouth 4 (four) times daily -  with meals and at bedtime. For 2 weeks. (Patient not taking: Reported on 08/05/2014) 420 mL 0   No current facility-administered medications on file prior to visit.    ALLERGIES: Allergies  Allergen Reactions  . Penicillins Hives    FAMILY HISTORY: Family History  Problem Relation Age of Onset  . Hyperlipidemia Maternal Grandmother   . Thyroid disease Mother     SOCIAL HISTORY: Social History  Social History  . Marital Status: Single    Spouse Name: N/A  . Number of Children: N/A  . Years of Education: N/A   Occupational History  . Mortgage Insurance    Social History Main Topics  . Smoking status: Current Every Day Smoker  . Smokeless tobacco: Never Used  . Alcohol Use: 0.0 oz/week    0 Standard drinks or equivalent per week      Comment: Occ  . Drug Use: No  . Sexual Activity: Not on file   Other Topics Concern  . Not on file   Social History Narrative    REVIEW OF SYSTEMS: Constitutional: No fevers, chills, or sweats, no generalized fatigue, change in appetite Eyes: No visual changes, double vision, eye pain Ear, nose and throat: No hearing loss, ear pain, nasal congestion, sore throat Cardiovascular: No chest pain, palpitations Respiratory:  No shortness of breath at rest or with exertion, wheezes GastrointestinaI: No nausea, vomiting, diarrhea, abdominal pain, fecal incontinence Genitourinary:  No dysuria, urinary retention or frequency Musculoskeletal:  + neck pain, no back pain Integumentary: No rash, pruritus, skin lesions Neurological: as above Psychiatric: No depression, insomnia, anxiety Endocrine: No palpitations, fatigue, diaphoresis, mood swings, change in appetite, change in weight, increased thirst Hematologic/Lymphatic:  No anemia, purpura, petechiae. Allergic/Immunologic: no itchy/runny eyes, nasal congestion, recent allergic reactions, rashes  PHYSICAL EXAM: Filed Vitals:   10/09/14 1340  BP: 120/72  Pulse: 73  Resp: 14   General: No acute distress Head:  Normocephalic/atraumatic Eyes: Fundoscopic exam shows bilateral sharp discs, no vessel changes, exudates, or hemorrhages Neck: supple, no paraspinal tenderness, full range of motion Back: No paraspinal tenderness Heart: regular rate and rhythm Lungs: Clear to auscultation bilaterally. Vascular: No carotid bruits. Skin/Extremities: No rash, no edema Neurological Exam: Mental status: alert and oriented to person, place, and time, no dysarthria or aphasia, Fund of knowledge is appropriate.  Recent and remote memory are intact. 2/3 delayed recall. Attention and concentration are normal.    Able to name objects and repeat phrases. Cranial nerves: CN I: not tested CN II: pupils equal, round and reactive to light, visual fields  intact, fundi unremarkable. CN III, IV, VI:  full range of motion, no nystagmus, no ptosis CN V: decreased cold on right V1-3, intact to pin and light touch. CN VII: upper and lower face symmetric CN VIII: hearing intact to finger rub CN IX, X: gag intact, uvula midline CN XI: sternocleidomastoid and trapezius muscles intact CN XII: tongue midline Bulk & Tone: normal, no fasciculations. Motor: 5/5 throughout with no pronator drift. Sensation: decreased cold on right LE, otherwise intact to light touch, pin, vibration and joint position sense.  No extinction to double simultaneous stimulation.  Romberg test negative Deep Tendon Reflexes: +2 throughout, no ankle clonus Plantar responses: downgoing bilaterally Cerebellar: no incoordination on finger to nose testing Gait: narrow-based and steady, able to tandem walk adequately. Tremor: none  IMPRESSION: This is a pleasant 29 year old left-handed woman with a history of migraines since childhood, presenting with worsening headaches, now occurring on a daily basis, with migraine exacerbations occurring 1-2 times a month. The daily headaches are most likely due to medication overuse headaches, however since she has never had any brain imaging in the past, MRI brain without contrast will be ordered to rule out underlying structural abnormality. She was advised to minimize intake of Excedrin and any other rescue medication to 2-3 a week to avoid rebound headaches. She will benefit from starting a daily headache  preventative medication, options were discussed, she will start low dose nortriptyline  qhs, with uptitration as tolerated. Side effects were discussed. We discussed pregnancy plans, nortriptyline is Category C, she will see how she does with the medication, and will discuss this with her OB as well. She will try Zomig nasal spray for migraine rescue and will call us if sample is helpful so we can send in refills. Tizanidine may be helpful in the  future, since migraines seem to start with neck pain. She will keep a calendar of her headaches and follow-up in 3 months. She knows to call our office for any changes.   Thank you for allowing me to participate in the care of this patient. Please do not hesitate to call for any questions or concerns.   Patrcia Dolly, M.D.  CC: Dr. Cleta Alberts

## 2014-10-14 ENCOUNTER — Other Ambulatory Visit: Payer: Self-pay | Admitting: Family Medicine

## 2014-10-14 DIAGNOSIS — G43109 Migraine with aura, not intractable, without status migrainosus: Secondary | ICD-10-CM

## 2014-10-14 MED ORDER — NORTRIPTYLINE HCL 10 MG PO CAPS: 10.0000 mg | ORAL_CAPSULE | Freq: Every day | ORAL | Status: DC

## 2014-10-14 NOTE — Telephone Encounter (Signed)
Received refill request from [facility] for 90 day supply

## 2014-10-15 ENCOUNTER — Ambulatory Visit (INDEPENDENT_AMBULATORY_CARE_PROVIDER_SITE_OTHER): Payer: 59

## 2014-10-15 ENCOUNTER — Ambulatory Visit (INDEPENDENT_AMBULATORY_CARE_PROVIDER_SITE_OTHER): Payer: 59 | Admitting: Family Medicine

## 2014-10-15 VITALS — BP 122/80 | HR 90 | Temp 98.2°F | Resp 16 | Ht 62.0 in | Wt 93.0 lb

## 2014-10-15 DIAGNOSIS — M25562 Pain in left knee: Secondary | ICD-10-CM

## 2014-10-15 DIAGNOSIS — M222X2 Patellofemoral disorders, left knee: Secondary | ICD-10-CM | POA: Diagnosis not present

## 2014-10-15 MED ORDER — CYCLOBENZAPRINE HCL 10 MG PO TABS: 10.0000 mg | ORAL_TABLET | Freq: Every day | ORAL | Status: DC

## 2014-10-15 MED ORDER — IBUPROFEN 600 MG PO TABS: 600.0000 mg | ORAL_TABLET | Freq: Three times a day (TID) | ORAL | Status: DC | PRN

## 2014-10-15 NOTE — Progress Notes (Signed)
   Subjective:    Patient ID: Misty Bauer, female    DOB: [birth year], 29 y.o.   MRN: [id number]  HPI Patient presents for left knee pain that has been present for the past 2 days. States that when she stood from using the bathroom she heard a pop in her knee. States that knee has been swollen which is improving and painful which is getting worse. Pain is sharp and sometimes has shooting pain up thigh. Pain is felt on front and on inside. Denies fever, chills, numbness, weakness, or loss of sensation. Has had gait change and is limping. Had injured knee in the past, but has never had any procedures or surgeries on knee. Has kept knee wrapped and elevated without much relief. Med allergy PCN.    Review of Systems As noted above.    Objective:   Physical Exam  Constitutional: She is oriented to person, place, and time. She appears well-developed and well-nourished. No distress.  Blood pressure 122/80, pulse 90, temperature 98.2 F (36.8 C), temperature source Oral, resp. rate 16, height  (1.575 m), weight 93 lb (42.185 kg), last menstrual period 10/05/2014, SpO2 99 %.  HENT:  Head: Normocephalic and atraumatic.  Right Ear: External ear normal.  Left Ear: External ear normal.  Eyes: Conjunctivae are normal. Right eye exhibits no discharge. Left eye exhibits no discharge. No scleral icterus.  Pulmonary/Chest: Effort normal.  Musculoskeletal: She exhibits tenderness. She exhibits no edema.       Left knee: She exhibits bony tenderness. She exhibits normal range of motion, no swelling, no effusion, no ecchymosis, no deformity, no erythema, normal alignment, no LCL laxity, normal patellar mobility, normal meniscus and no MCL laxity. Tenderness found. Medial joint line, lateral joint line and patellar tendon tenderness noted.  Neurological: She is alert and oriented to person, place, and time. She has normal strength and normal reflexes. She displays no atrophy. No cranial nerve deficit or  sensory deficit. She exhibits normal muscle tone. Coordination normal.  Skin: Skin is warm and dry. No rash noted. She is not diaphoretic. No erythema. No pallor.  Psychiatric: She has a normal mood and affect. Her behavior is normal. Judgment and thought content normal.   [facility] reading (PRIMARY) by  Dr. Conley Rolls. No acute bony abnormalities. Patella shifted.     Assessment & Plan:  1. Patella-femoral syndrome, left 2. Knee pain, acute, left Hinged knee brace given. Knee exercises given and discussed. - DG Knee Complete 4 Views Left; Future - ibuprofen (ADVIL,MOTRIN) 600 MG tablet; Take 1 tablet (600 mg total) by mouth every 8 (eight) hours as needed.  Dispense: 30 tablet; Refill: 0   Maikel Neisler PA-C  [facility] 10/15/2014 5:49 PM

## 2014-10-15 NOTE — Patient Instructions (Signed)
Generic Knee Exercises EXERCISES RANGE OF MOTION (ROM) AND STRETCHING EXERCISES These exercises may help you when beginning to rehabilitate your injury. Your symptoms may resolve with or without further involvement from your physician, physical therapist, or athletic trainer. While completing these exercises, remember:   Restoring tissue flexibility helps normal motion to return to the joints. This allows healthier, less painful movement and activity.  An effective stretch should be held for at least 30 seconds.  A stretch should never be painful. You should only feel a gentle lengthening or release in the stretched tissue. STRETCH - Knee Extension, Prone  Lie on your stomach on a firm surface, such as a bed or countertop. Place your right / left knee and leg just beyond the edge of the surface. You may wish to place a towel under the far end of your right / left thigh for comfort.  Relax your leg muscles and allow gravity to straighten your knee. Your clinician may advise you to add an ankle weight if more resistance is helpful for you.  You should feel a stretch in the back of your right / left knee. Hold this position for __________ seconds. Repeat __________ times. Complete this stretch __________ times per day. * Your physician, physical therapist, or athletic trainer may ask you to add ankle weight to enhance your stretch.  RANGE OF MOTION - Knee Flexion, Active  Lie on your back with both knees straight. (If this causes back discomfort, bend your opposite knee, placing your foot flat on the floor.)  Slowly slide your heel back toward your buttocks until you feel a gentle stretch in the front of your knee or thigh.  Hold for __________ seconds. Slowly slide your heel back to the starting position. Repeat __________ times. Complete this exercise __________ times per day.  STRETCH - Quadriceps, Prone   Lie on your stomach on a firm surface, such as a bed or padded floor.  Bend your  right / left knee and grasp your ankle. If you are unable to reach your ankle or pant leg, use a belt around your foot to lengthen your reach.  Gently pull your heel toward your buttocks. Your knee should not slide out to the side. You should feel a stretch in the front of your thigh and/or knee.  Hold this position for __________ seconds. Repeat __________ times. Complete this stretch __________ times per day.  STRETCH - Hamstrings, Supine   Lie on your back. Loop a belt or towel over the ball of your right / left foot.  Straighten your right / left knee and slowly pull on the belt to raise your leg. Do not allow the right / left knee to bend. Keep your opposite leg flat on the floor.  Raise the leg until you feel a gentle stretch behind your right / left knee or thigh. Hold this position for __________ seconds. Repeat __________ times. Complete this stretch __________ times per day.  STRENGTHENING EXERCISES These exercises may help you when beginning to rehabilitate your injury. They may resolve your symptoms with or without further involvement from your physician, physical therapist, or athletic trainer. While completing these exercises, remember:   Muscles can gain both the endurance and the strength needed for everyday activities through controlled exercises.  Complete these exercises as instructed by your physician, physical therapist, or athletic trainer. Progress the resistance and repetitions only as guided.  You may experience muscle soreness or fatigue, but the pain or discomfort you are trying to   eliminate should never worsen during these exercises. If this pain does worsen, stop and make certain you are following the directions exactly. If the pain is still present after adjustments, discontinue the exercise until you can discuss the trouble with your clinician. STRENGTH - Quadriceps, Isometrics  Lie on your back with your right / left leg extended and your opposite knee  bent.  Gradually tense the muscles in the front of your right / left thigh. You should see either your knee cap slide up toward your hip or increased dimpling just above the knee. This motion will push the back of the knee down toward the floor/mat/bed on which you are lying.  Hold the muscle as tight as you can without increasing your pain for __________ seconds.  Relax the muscles slowly and completely in between each repetition. Repeat __________ times. Complete this exercise __________ times per day.  STRENGTH - Quadriceps, Short Arcs   Lie on your back. Place a __________ inch towel roll under your knee so that the knee slightly bends.  Raise only your lower leg by tightening the muscles in the front of your thigh. Do not allow your thigh to rise.  Hold this position for __________ seconds. Repeat __________ times. Complete this exercise __________ times per day.  OPTIONAL ANKLE WEIGHTS: Begin with ____________________, but DO NOT exceed ____________________. Increase in 1 pound/0.5 kilogram increments.  STRENGTH - Quadriceps, Straight Leg Raises  Quality counts! Watch for signs that the quadriceps muscle is working to insure you are strengthening the correct muscles and not "cheating" by substituting with healthier muscles.  Lay on your back with your right / left leg extended and your opposite knee bent.  Tense the muscles in the front of your right / left thigh. You should see either your knee cap slide up or increased dimpling just above the knee. Your thigh may even quiver.  Tighten these muscles even more and raise your leg 4 to 6 inches off the floor. Hold for __________ seconds.  Keeping these muscles tense, lower your leg.  Relax the muscles slowly and completely in between each repetition. Repeat __________ times. Complete this exercise __________ times per day.  STRENGTH - Hamstring, Curls  Lay on your stomach with your legs extended. (If you lay on a bed, your feet  may hang over the edge.)  Tighten the muscles in the back of your thigh to bend your right / left knee up to 90 degrees. Keep your hips flat on the bed/floor.  Hold this position for __________ seconds.  Slowly lower your leg back to the starting position. Repeat __________ times. Complete this exercise __________ times per day.  OPTIONAL ANKLE WEIGHTS: Begin with ____________________, but DO NOT exceed ____________________. Increase in 1 pound/0.5 kilogram increments.  STRENGTH - Quadriceps, Squats  Stand in a door frame so that your feet and knees are in line with the frame.  Use your hands for balance, not support, on the frame.  Slowly lower your weight, bending at the hips and knees. Keep your lower legs upright so that they are parallel with the door frame. Squat only within the range that does not increase your knee pain. Never let your hips drop below your knees.  Slowly return upright, pushing with your legs, not pulling with your hands. Repeat __________ times. Complete this exercise __________ times per day.  STRENGTH - Quadriceps, Wall Slides  Follow guidelines for form closely. Increased knee pain often results from poorly placed feet or knees.    Lean against a smooth wall or door and walk your feet out 18-24 inches. Place your feet hip-width apart.  Slowly slide down the wall or door until your knees bend __________ degrees.* Keep your knees over your heels, not your toes, and in line with your hips, not falling to either side.  Hold for __________ seconds. Stand up to rest for __________ seconds in between each repetition. Repeat __________ times. Complete this exercise __________ times per day. * Your physician, physical therapist, or athletic trainer will alter this angle based on your symptoms and progress.   This information is not intended to replace advice given to you by your health care provider. Make sure you discuss any questions you have with your health care  provider.   Document Released: 11/09/2004 Document Revised: 01/16/2014 Document Reviewed: 04/09/2008 Elsevier Interactive Patient Education 2016 [employer]  

## 2014-10-20 ENCOUNTER — Telehealth: Payer: Self-pay | Admitting: Family Medicine

## 2014-10-20 MED ORDER — ZOLMITRIPTAN 5 MG NA SOLN: 1.0000 | NASAL | Status: DC | PRN

## 2014-10-20 NOTE — Telephone Encounter (Signed)
Patient was notified of result. She is also requesting an Rx of Zomig nasal spray be sent to her pharmacy. She state she did use the sample that was given to her and it did work. Will send new Rx to her pharmacy.

## 2014-10-20 NOTE — Telephone Encounter (Signed)
-----   Message from Van Clines, MD sent at 10/19/2014  3:44 PM EDT ----- Regarding: MRI results Pls let her know I reviewed MRI brain and it is normal, no evidence of tumor, stroke, or bleed. Thanks

## 2014-11-23 ENCOUNTER — Telehealth: Payer: Self-pay | Admitting: Neurology

## 2014-11-23 DIAGNOSIS — G43109 Migraine with aura, not intractable, without status migrainosus: Secondary | ICD-10-CM

## 2014-11-23 NOTE — Telephone Encounter (Signed)
VM-PT left message to have a call back in regards to her medication and did not state what it was in her voicemail message/Dawn CB# [phone]

## 2014-11-23 NOTE — Telephone Encounter (Signed)
Will send Rx in for Zomig. She says she has woken up the past 3 mornings with a headache, wants to know if she can increase her nortriptyline.

## 2014-11-24 MED ORDER — ZOLMITRIPTAN 5 MG NA SOLN: 1.0000 | NASAL | Status: DC | PRN

## 2014-11-24 MED ORDER — NORTRIPTYLINE HCL 10 MG PO CAPS: ORAL_CAPSULE | ORAL | Status: DC

## 2014-11-24 NOTE — Telephone Encounter (Signed)
Yes to increase to 20mg  qhs. THanks

## 2014-11-24 NOTE — Telephone Encounter (Signed)
I spoke with patient. She will increase to 20 mg qhs. Will send in new Rx to reflect change.

## 2015-01-19 ENCOUNTER — Ambulatory Visit: Payer: 59 | Admitting: Neurology

## 2015-02-01 ENCOUNTER — Encounter (HOSPITAL_COMMUNITY): Payer: Self-pay | Admitting: Family Medicine

## 2015-02-01 ENCOUNTER — Emergency Department (HOSPITAL_COMMUNITY)
Admission: EM | Admit: 2015-02-01 | Discharge: 2015-02-01 | Disposition: A | Discharge status: Home / Self Care | Payer: Managed Care, Other (non HMO) | Attending: Emergency Medicine | Admitting: Emergency Medicine

## 2015-02-01 DIAGNOSIS — S39012A Strain of muscle, fascia and tendon of lower back, initial encounter: Secondary | ICD-10-CM | POA: Insufficient documentation

## 2015-02-01 DIAGNOSIS — Z3202 Encounter for pregnancy test, result negative: Secondary | ICD-10-CM | POA: Insufficient documentation

## 2015-02-01 DIAGNOSIS — Z791 Long term (current) use of non-steroidal anti-inflammatories (NSAID): Secondary | ICD-10-CM | POA: Diagnosis not present

## 2015-02-01 DIAGNOSIS — M545 Low back pain, unspecified: Secondary | ICD-10-CM

## 2015-02-01 DIAGNOSIS — Z8719 Personal history of other diseases of the digestive system: Secondary | ICD-10-CM | POA: Insufficient documentation

## 2015-02-01 DIAGNOSIS — F17200 Nicotine dependence, unspecified, uncomplicated: Secondary | ICD-10-CM | POA: Diagnosis not present

## 2015-02-01 DIAGNOSIS — Z79899 Other long term (current) drug therapy: Secondary | ICD-10-CM | POA: Insufficient documentation

## 2015-02-01 DIAGNOSIS — Z8659 Personal history of other mental and behavioral disorders: Secondary | ICD-10-CM | POA: Insufficient documentation

## 2015-02-01 DIAGNOSIS — R110 Nausea: Secondary | ICD-10-CM | POA: Insufficient documentation

## 2015-02-01 DIAGNOSIS — R109 Unspecified abdominal pain: Secondary | ICD-10-CM | POA: Diagnosis not present

## 2015-02-01 DIAGNOSIS — G43909 Migraine, unspecified, not intractable, without status migrainosus: Secondary | ICD-10-CM | POA: Diagnosis not present

## 2015-02-01 DIAGNOSIS — Z862 Personal history of diseases of the blood and blood-forming organs and certain disorders involving the immune mechanism: Secondary | ICD-10-CM | POA: Insufficient documentation

## 2015-02-01 DIAGNOSIS — Z7951 Long term (current) use of inhaled steroids: Secondary | ICD-10-CM | POA: Insufficient documentation

## 2015-02-01 DIAGNOSIS — X58XXXA Exposure to other specified factors, initial encounter: Secondary | ICD-10-CM | POA: Insufficient documentation

## 2015-02-01 DIAGNOSIS — Y929 Unspecified place or not applicable: Secondary | ICD-10-CM | POA: Insufficient documentation

## 2015-02-01 DIAGNOSIS — Y9389 Activity, other specified: Secondary | ICD-10-CM | POA: Insufficient documentation

## 2015-02-01 DIAGNOSIS — Y999 Unspecified external cause status: Secondary | ICD-10-CM | POA: Insufficient documentation

## 2015-02-01 DIAGNOSIS — Z880 Allergy status to penicillin: Secondary | ICD-10-CM | POA: Diagnosis not present

## 2015-02-01 DIAGNOSIS — Z87442 Personal history of urinary calculi: Secondary | ICD-10-CM | POA: Insufficient documentation

## 2015-02-01 DIAGNOSIS — T148XXA Other injury of unspecified body region, initial encounter: Secondary | ICD-10-CM

## 2015-02-01 LAB — URINALYSIS, ROUTINE W REFLEX MICROSCOPIC
BILIRUBIN URINE: NEGATIVE
GLUCOSE, UA: NEGATIVE mg/dL
Hgb urine dipstick: NEGATIVE
KETONES UR: NEGATIVE mg/dL
Leukocytes, UA: NEGATIVE
NITRITE: NEGATIVE
PH: 6.5 (ref 5.0–8.0)
Protein, ur: NEGATIVE mg/dL
Specific Gravity, Urine: 1.018 (ref 1.005–1.030)

## 2015-02-01 LAB — POC URINE PREG, ED: Preg Test, Ur: NEGATIVE

## 2015-02-01 MED ORDER — IBUPROFEN 400 MG PO TABS
800.0000 mg | ORAL_TABLET | Freq: Once | ORAL | Status: AC
  Administered 2015-02-01: 800 mg via ORAL
  Filled 2015-02-01: qty 2

## 2015-02-01 MED ORDER — CYCLOBENZAPRINE HCL 5 MG PO TABS: 5.0000 mg | ORAL_TABLET | Freq: Three times a day (TID) | ORAL | Status: DC | PRN

## 2015-02-01 MED ORDER — CYCLOBENZAPRINE HCL 10 MG PO TABS
5.0000 mg | ORAL_TABLET | Freq: Once | ORAL | Status: AC
  Administered 2015-02-01: 5 mg via ORAL
  Filled 2015-02-01: qty 1

## 2015-02-01 MED ORDER — IBUPROFEN 800 MG PO TABS: 800.0000 mg | ORAL_TABLET | Freq: Three times a day (TID) | ORAL | Status: DC

## 2015-02-01 NOTE — Discharge Instructions (Signed)
Back Pain, Adult °Back pain is very common in adults. The cause of back pain is rarely dangerous and the pain often gets better over time. The cause of your back pain may not be known. Some common causes of back pain include: °· Strain of the muscles or ligaments supporting the spine. °· Wear and tear (degeneration) of the spinal disks. °· Arthritis. °· Direct injury to the back. °For many people, back pain may return. Since back pain is rarely dangerous, most people can learn to manage this condition on their own. °HOME CARE INSTRUCTIONS °Watch your back pain for any changes. The following actions may help to lessen any discomfort you are feeling: °· Remain active. It is stressful on your back to sit or stand in one place for long periods of time. Do not sit, drive, or stand in one place for more than 30 minutes at a time. Take short walks on even surfaces as soon as you are able. Try to increase the length of time you walk each day. °· Exercise regularly as directed by your health care provider. Exercise helps your back heal faster. It also helps avoid future injury by keeping your muscles strong and flexible. °· Do not stay in bed. Resting more than 1-2 days can delay your recovery. °· Pay attention to your body when you bend and lift. The most comfortable positions are those that put less stress on your recovering back. Always use proper lifting techniques, including: °¨ Bending your knees. °¨ Keeping the load close to your body. °¨ Avoiding twisting. °· Find a comfortable position to sleep. Use a firm mattress and lie on your side with your knees slightly bent. If you lie on your back, put a pillow under your knees. °· Avoid feeling anxious or stressed. Stress increases muscle tension and can worsen back pain. It is important to recognize when you are anxious or stressed and learn ways to manage it, such as with exercise. °· Take medicines only as directed by your health care provider. Over-the-counter  medicines to reduce pain and inflammation are often the most helpful. Your health care provider may prescribe muscle relaxant drugs. These medicines help dull your pain so you can more quickly return to your normal activities and healthy exercise. °· Apply ice to the injured area: °¨ Put ice in a plastic bag. °¨ Place a towel between your skin and the bag. °¨ Leave the ice on for 20 minutes, 2-3 times a day for the first 2-3 days. After that, ice and heat may be alternated to reduce pain and spasms. °· Maintain a healthy weight. Excess weight puts extra stress on your back and makes it difficult to maintain good posture. °SEEK MEDICAL CARE IF: °· You have pain that is not relieved with rest or medicine. °· You have increasing pain going down into the legs or buttocks. °· You have pain that does not improve in one week. °· You have night pain. °· You lose weight. °· You have a fever or chills. °SEEK IMMEDIATE MEDICAL CARE IF:  °· You develop new bowel or bladder control problems. °· You have unusual weakness or numbness in your arms or legs. °· You develop nausea or vomiting. °· You develop abdominal pain. °· You feel faint. °  °This information is not intended to replace advice given to you by your health care provider. Make sure you discuss any questions you have with your health care provider. °  °Document Released: 12/26/2004 Document Revised: 01/16/2014 Document Reviewed: 04/29/2013 °Elsevier Interactive Patient Education ©2016 Elsevier   Inc. ° °Emergency Department Resource Guide °1) Find a Doctor and Pay Out of Pocket °Although you won't have to find out who is covered by your insurance plan, it is a good idea to ask around and get recommendations. You will then need to call the office and see if the doctor you have chosen will accept you as a new patient and what types of options they offer for patients who are self-pay. Some doctors offer discounts or will set up payment plans for their patients who do not  have insurance, but you will need to ask so you aren't surprised when you get to your appointment. ° °2) Contact Your Local Health Department °Not all health departments have doctors that can see patients for sick visits, but many do, so it is worth a call to see if yours does. If you don't know where your local health department is, you can check in your phone book. The CDC also has a tool to help you locate your state's health department, and many state websites also have listings of all of their local health departments. ° °3) Find a Walk-in Clinic °If your illness is not likely to be very severe or complicated, you may want to try a walk in clinic. These are popping up all over the country in pharmacies, drugstores, and shopping centers. They're usually staffed by nurse practitioners or physician assistants that have been trained to treat common illnesses and complaints. They're usually fairly quick and inexpensive. However, if you have serious medical issues or chronic medical problems, these are probably not your best option. ° °No Primary Care Doctor: °- Call [facility] at  832-8000 - they can help you locate a primary care doctor that  accepts your insurance, provides certain services, etc. °- Physician Referral Service- 1-800-533-3463 ° °Chronic Pain Problems: °Organization         Address  Phone   Notes  °[facility]  (336) 297-2271 Patients need to be referred by their primary care doctor.  ° °Medication Assistance: °Organization         Address  Phone   Notes  °[place] [facility] [address] °[place], [place] [address] (336) 641-8030 --Must be a resident of [place] °-- Must have NO insurance coverage whatsoever (no Medicaid/ Medicare, etc.) °-- The pt. MUST have a primary care doctor that directs their care regularly and follows them in the community °  °[facility]  (866) 331-1348   °[employer]  (888) 892-1162   ° °Agencies that  provide inexpensive medical care: °Organization         Address  Phone   Notes  °[facility]  (336) 832-8035   °[facility]    (336) 832-7272   °[facility] [address] °[place], [place] [address] (336) 832-4777   °[facility] [address], °[place] (336) 271-4999   °[facility]    (336) 373-0678   °[facility]    (336) 272-1050   °[facility] ° [address], [place] Phone:  (336) 832-4444, Fax:  (336) 832-4440 Hours of Operation:  9 am - 6 pm, M-F.  Also accepts Medicaid/Medicare and self-pay.  °[facility] ° [address], [place] Phone: (336) 832-3150, Fax: (336) 832-3151. Hours of Operation:  8:30 am - 5:30 pm, M-F.  Also accepts Medicaid and self-pay.  °[facility]   [place] [address], [place] Phone: (336) 878-6027   °[facility] [address], [place], [place] (336)723-1848, Ext. 123 Mondays & Thursdays: 7-9 AM.  First 15 patients are seen on a first come, first serve basis. °  ° °Medicaid-accepting [place] Providers: ° °Organization         Address  Phone   Notes  °[facility] [address], [place] (336) 641-2100 Also accepts self-pay patients.  °[facility] [address], [place] ° (336) 856-9996   °[facility] [address], [place] (336) 288-8857   °[facility] [address] [place] [address], [place] (336) 299-7000   °Veita Bland [address], [place]  ° (336) 373-1557 Only accepts [place] Access Medicaid patients after they have their name applied to their card.  ° °Self-Pay (no insurance) in [place]: ° °Organization         Address  Phone   Notes  °Sickle Cell Patients, [facility] [address], [place] (336) 832-1970   °[facility] [address], [place] (336) 832-4400   °[facility] ° [address], [place] (336) 992-4800   °[facility]/Dr. Osei-Bonsu ° [address] [place] [address], [place] or [address], [place] (336) 841-8500 Phone number for both [place] and [place] locations is the same.  °[facility] [address], [place] (336) 299-0000   °[facility] [address] [place] [address], [place] or [address] (336) 852-7530 °(336) 878-2260   °[facility] [address], [place] (336) 350-1642, phone; (336) 294-5005, fax Sees patients 1st and 3rd Saturday of every month.  Must not qualify for public or private insurance (i.e. Medicaid, Medicare, [place] Health Choice, Veterans' Benefits) • Household income should be no more than 200% of the poverty level •The clinic cannot treat you if you are pregnant or think you are pregnant • Sexually transmitted diseases are not treated at the clinic.  ° ° °Dental Care: °Organization         Address  Phone  Notes  °[facility] [facility] [address], [place] (336) 641-6152 Accepts children up to age 21 who are enrolled in Medicaid or [place] Health Choice; pregnant women with a Medicaid card; and children who have applied for Medicaid or [place] Health Choice, but were declined, whose parents can pay a reduced fee at time of service.  °[facility] [place]  [address], [place] (336) 641-7733 Accepts children up to age 21 who are enrolled in Medicaid or [place] Health Choice; pregnant women with a Medicaid card; and children who have applied for Medicaid or [place] Health Choice, but were declined, whose parents can pay a reduced fee at time of service.  °[facility] ° [address],  (336) 641-4533 Patients are seen by appointment only. Walk-ins  are not accepted. [facility] will see patients 18 years of age and older. °Monday - Tuesday (8am-5pm) °Most Wednesdays (8:30-5pm) °$30 per visit, cash only  °[facility] ° [address], [place] (336) 641-4533 Patients are seen by appointment only. Walk-ins are not accepted. [facility] will see patients 18 years of age   and older. °One Wednesday Evening (Monthly: Volunteer Based).  $30 per visit, cash only  °[facility]  (919) 537-3737 for adults; Children under age 4, call Graduate Pediatric Dentistry at (919) [phone]. Children aged 4-14, please call (919) 537-3737 to request a pediatric application. ° Dental services are provided in all areas of dental care including fillings, crowns and bridges, complete and partial dentures, implants, gum treatment, root canals, and extractions. Preventive care is also provided. Treatment is provided to both adults and children. °Patients are selected via a lottery and there is often a waiting list. °  °[facility] [address], °[place] ° (336) 763-8833 [email or web address] °  °[facility] [address], [place], [place] (336)723-1848, Ext. 123 Second and Fourth Thursday of each month, opens at 6:30 AM; Clinic ends at 9 AM.  Patients are seen on a first-come first-served basis, and a limited number are seen during each clinic.  ° °[facility] ° [address], [place], [place] (336) 723-7904   Eligibility Requirements °You must have lived in [place], [place], or [place] counties for at least the last three months. °  You cannot be eligible for state or federal sponsored healthcare insurance, including Veterans Administration, Medicaid, or Medicare. °  You generally cannot be eligible for healthcare insurance through your employer.  °  How to apply: °Eligibility screenings are held every Tuesday and Wednesday afternoon from 1:00 pm until 4:00 pm. You do not need an appointment  for the interview!  °[facility] [address], [place], [place] 336-631-2330   °[facility] [facility]  336-342-8273   °[facility]  336-703-3100   ° [facility]  336-570-6415   ° °Behavioral Health Resources in the Community: °Intensive Outpatient Programs °Organization         Address  Phone  Notes  °[facility] [address], [place], [place] 336-878-6098   °[facility] [address], [place], [place] 336-832-9800   °[facility] [address], [place], [place] ° 336-882-2125   °[facility] [address],  °[place], [place] 1-800-853-5163 or 336-641-4981   °Substance Abuse Resources °Organization         Address  Phone  Notes  °[facility]  336-882-2125   °[facility]  336-784-9470   °[facility]  336-285-9073   °[facility]  336-845-3988   °Residential & Outpatient Substance Abuse Program  1-800-659-3381   °Psychological Services °Organization         Address  Phone  Notes  °[facility]  336- 832-9600   °[facility]  336- 378-7881   °[facility] [address], [place] 1-800-853-5163 or 336-641-4981   ° °Mobile Crisis Teams °Organization         Address  Phone  Notes  °[facility], Mobile Crisis Care Unit  1-877-626-1772   °Assertive °[facility] ° [address] [place], [place] 336-834-9664   °Sharon DeEsch [address] °[place] 336-554-5454   ° °Self-Help/Support Groups °Organization         Address  Phone             Notes  °[facility]  - variety of support groups  336- 373-1402 Call for more information  °Narcotics Anonymous (NA), [facility] [address], °[place]  2 meetings at   this location  ° °Residential Treatment Programs °Organization         Address  Phone  Notes  °[facility] [address],    °[place]  1-866-801-8205   °[facility] ° [address], [place], [place] 704-293-8524   °[facility] [address], [place] 336-845-3988 Admissions: 8am-3pm M-F  °[facility] [address]    °[place], [place] 336-841-1104   °[facility] [address], [place], [place] 336-379-7146   °[facility] [address]  °[place], [place] 336-285-9073   °[facility] Intensive Outpatient [address], [place], [place] 336-852-3033   °[facility] [address]  °[place], [place] 1-877-615-2722 or 336-784-9470   °[facility] [address] [place], [place] 336-227-7417 Accepts Medicaid  °[facility] [address]  °[place] 1-800-659-3381 Substance Abuse/Addiction Treatment  ° °[place] Behavioral Health Resources °Organization         Address  Phone  Notes  °[facility]  (888) 581-9988   °Julie Brannon, PhD [address] [place], [place]   (336) 349-5553 or (336) 951-0000   °[facility]   [address] °[place], [place] (336) 349-4454   °[facility] [address], [place], [place] (336) 342-8316 Insurance/Medicaid/sponsorship through [facility]  °[facility] [address]                                    [place], [place] (336) 342-8316 Therapy/tele-psych/case  °[facility] [address]  ° [place], [place] (336) 349-2233    °Dr. Arfeen  (336) 349-4544   °[facility] [place]  [employer] [place] [facility] 1) [address], [place] °2) [address], [place] °3)  [address], [place] (336) 349-3220 °(336) 342-7768 ° °(336) 342-8140   °[place] [facility] (336) 342-1394 or (336) 342-3537 (After Hours)    ° ° ° °

## 2015-02-01 NOTE — ED Notes (Signed)
Pt here for bilateral lower back pain and urinary frequency. sts that she passed a kidney stone yesterday. Worried she has a kidney infection.

## 2015-02-01 NOTE — ED Provider Notes (Signed)
CSN: [id number]     Arrival date & time 02/01/15  1755 History  By signing my name below, I, Emmanuella Mensah, attest that this documentation has been prepared under the direction and in the presence of Cheri Fowler, PA-C. Electronically Signed: Angelene Giovanni, ED Scribe. 02/01/2015. 10:13 PM.    Chief Complaint  Patient presents with  . Back Pain  . Urinary Frequency   The history is provided by the patient. No language interpreter was used.   HPI Comments: Misty Bauer is a 30 y.o. female who presents to the Emergency Department complaining of gradually worsening moderate bilateral lower back pain onset 2 days ago. She reports associated  nausea and increased urinary frequency which has since resolved. She believed that she passed a kidney stone yesterday. She states that turning in a certain way makes the pain worse. She reports that she has been taking Goody Powders with no relief. She denies any fever, vomiting, abdominal pain, vaginal discharge, dysuria, hematuria, bladder/bowel incontinence, or saddle anesthesia.    Past Medical History  Diagnosis Date  . GERD (gastroesophageal reflux disease)   . ITP (idiopathic thrombocytopenic purpura)   . Migraine   . Allergy   . Anxiety   . Clotting disorder (HCC)   . Depression    Past Surgical History  Procedure Laterality Date  . Wisdom tooth extraction  2014   Family History  Problem Relation Age of Onset  . Hyperlipidemia Maternal Grandmother   . Thyroid disease Mother    Social History  Substance Use Topics  . Smoking status: Current Every Day Smoker  . Smokeless tobacco: Never Used  . Alcohol Use: 0.0 oz/week    0 Standard drinks or equivalent per week     Comment: Occ   OB History    No data available     Review of Systems  Constitutional: Negative for fever and chills.  Gastrointestinal: Positive for nausea and abdominal pain. Negative for vomiting.  Genitourinary: Negative for dysuria and hematuria.   Musculoskeletal: Positive for back pain.  All other systems reviewed and are negative.     Allergies  Penicillins  Home Medications   Prior to Admission medications   Medication Sig Start Date End Date Taking? Authorizing Provider  butalbital-aspirin-caffeine [facility]) 50-325-40 MG per capsule Take 1 capsule by mouth every 6 (six) hours as needed for headache. Patient not taking: Reported on 10/09/2014 08/05/14   Collene Gobble, MD  cyclobenzaprine (FLEXERIL) 5 MG tablet Take 1 tablet (5 mg total) by mouth 3 (three) times daily as needed for muscle spasms. 02/01/15   Daksha Koone, PA-C  fluticasone (FLONASE) 50 MCG/ACT nasal spray Place into both nostrils daily.    Historical Provider, MD  ibuprofen (ADVIL,MOTRIN) 800 MG tablet Take 1 tablet (800 mg total) by mouth 3 (three) times daily. 02/01/15   Cheri Fowler, PA-C  norethindrone-ethinyl estradiol 1/35 (ORTHO-NOVUM, NORTREL,CYCLAFEM) tablet Take 1 tablet by mouth daily.    Historical Provider, MD  nortriptyline (PAMELOR) 10 MG capsule Take 2 capsules at bedtime. 11/24/14   Van Clines, MD  pantoprazole (PROTONIX) 40 MG tablet Take 1 tablet (40 mg total) by mouth daily. Patient not taking: Reported on 10/09/2014 05/20/14   Charm Rings, MD  sucralfate (CARAFATE) 1 GM/10ML suspension Take 10 mLs (1 g total) by mouth 4 (four) times daily -  with meals and at bedtime. For 2 weeks. Patient not taking: Reported on 08/05/2014 05/20/14   Charm Rings, MD  zolmitriptan (ZOMIG) 5 MG nasal  solution Place 1 spray into the nose as needed for migraine. Use at onset of headache, may repeat in 2 hours if needed. Do not use more than 3 times per week. 11/24/14   Van Clines, MD   BP 121/75 mmHg  Pulse 105  Temp(Src) 98.3 F (36.8 C) (Oral)  Resp 14  SpO2 100%  LMP 01/12/2015 Physical Exam  Constitutional: She is oriented to person, place, and time. She appears well-developed and well-nourished.  Non-toxic appearance. She does not have a sickly  appearance. She does not appear ill.  HENT:  Head: Normocephalic and atraumatic.  Mouth/Throat: Oropharynx is clear and moist.  Eyes: Conjunctivae are normal. Pupils are equal, round, and reactive to light. No scleral icterus.  Neck: Normal range of motion. Neck supple. No tracheal deviation present.  Cardiovascular: Normal rate, regular rhythm and normal heart sounds.   No murmur heard. Pulmonary/Chest: Effort normal and breath sounds normal. No accessory muscle usage or stridor. No respiratory distress. She has no wheezes. She has no rhonchi. She has no rales.  Abdominal: Soft. Bowel sounds are normal. She exhibits no distension. There is no tenderness. There is no rigidity, no rebound, no guarding and no CVA tenderness.  Musculoskeletal: She exhibits tenderness.       Cervical back: Normal.       Thoracic back: Normal.       Lumbar back: She exhibits decreased range of motion (secondary to pain), tenderness, pain and spasm (right side). She exhibits no bony tenderness, no swelling, no edema, no deformity, no laceration and normal pulse.       Back:  Lymphadenopathy:    She has no cervical adenopathy.  Neurological: She is alert and oriented to person, place, and time.  Speech clear without dysarthria. Strength and sensation intact bilaterally throughout lower extremities. No saddle anesthesia.  Skin: Skin is warm and dry.  Psychiatric: She has a normal mood and affect. Her behavior is normal.    ED Course  Procedures (including critical care time) DIAGNOSTIC STUDIES: Oxygen Saturation is 100% on RA, normal by my interpretation.   COORDINATION OF CARE: 10:12 PM- Pt advised of plan for treatment and pt agrees. Explained that her symptom does not warrant an x-ray. Pt will provide resource guide for establishing PCP. Advised to return if she develops dysuria, hematuria, or worsening symptoms.   Labs Review Labs Reviewed  URINALYSIS, ROUTINE W REFLEX MICROSCOPIC (NOT AT [facility]) -  Abnormal; Notable for the following:    APPearance HAZY (*)    All other components within normal limits  POC URINE PREG, ED    Cheri Fowler, PA-C has personally reviewed and evaluated these lab results as part of her medical decision-making.  MDM   Final diagnoses:  Right-sided low back pain without sciatica  Muscle strain   Patient presents with lower back pain and increased urinary frequency. She reports she thinks she may have passed a kidney stone yesterday. Denies injury or trauma. No red flags. VSS, NAD. On exam, no abdominal tenderness or CVA tenderness. Moderate tenderness to palpation along right paraspinal musculature with decreased range of motion secondary to pain. Mild spasm noted. No saddle anesthesia. Strength and sensation intact bilaterally throughout lower extremities. Patient ambulatory without difficulty. Urinalysis unremarkable, no evidence of hematuria, leukocytes, or bacteria. Urine pregnancy negative. Doubt pyelonephritis, UTI, or nephrolithiasis. Suspect musculoskeletal etiology. Patient given ibuprofen and Flexeril. Discussed return precautions. Follow up PCP. Patient given outpatient resource guide to establish primary care. Patient agrees and acknowledges the  above plan for discharge.  I personally performed the services described in this documentation, which was scribed in my presence. The recorded information has been reviewed and is accurate.   Cheri Fowler, PA-C 02/01/15 2234  Mancel Bale, MD 02/02/15 [phone]

## 2015-02-01 NOTE — ED Notes (Signed)
Pt st's she thinks she is passing a kidney stone.  Has hx of same.  Pt c/o lower back pain worse on right, onset 2 days ago.

## 2015-02-03 ENCOUNTER — Ambulatory Visit (INDEPENDENT_AMBULATORY_CARE_PROVIDER_SITE_OTHER): Payer: 59 | Admitting: Neurology

## 2015-02-03 ENCOUNTER — Encounter: Payer: Self-pay | Admitting: Neurology

## 2015-02-03 VITALS — BP 128/70 | HR 78 | Ht 62.5 in | Wt 98.0 lb

## 2015-02-03 DIAGNOSIS — G43109 Migraine with aura, not intractable, without status migrainosus: Secondary | ICD-10-CM | POA: Diagnosis not present

## 2015-02-03 MED ORDER — ZOLMITRIPTAN 5 MG NA SOLN: 1.0000 | NASAL | Status: DC | PRN

## 2015-02-03 MED ORDER — NORTRIPTYLINE HCL 10 MG PO CAPS: ORAL_CAPSULE | ORAL | Status: DC

## 2015-02-03 NOTE — Patient Instructions (Signed)
1. Increase nortriptyline : take 3 caps at bedtime. Call our office in 3 months to update [place] on how you are doing. If doing well, we will plan to stay on same dose, if still having frequent headaches, we will plan to increase dose. 2. Take Zomig nasal spray at onset of migraines. Do not take any rescue medication more than 2-3 times a week to avoid rebound headaches 3. Keep a headache calendar, follow-up in 8 months. Call for any problems

## 2015-02-03 NOTE — Progress Notes (Signed)
NEUROLOGY FOLLOW UP OFFICE NOTE  Misty Bauer [id number]  HISTORY OF PRESENT ILLNESS: I had the pleasure of seeing Misty Bauer in follow-up in the neurology clinic on 02/03/2015.  The patient was last seen 3 months ago for worsening headaches with daily mild headaches and migraine exacerbations 1-2 times a month. Records and images were personally reviewed where available.  I personally reviewed MRI brain without contrast which was normal. She was started on low dose nortriptyline  qhs which she is tolerating without side effects. She reports being migraine-free for 2 months, however had status migrainosus Christmas-time, with a migraine that lasted for 16 days. She did not have her Zomig nasal spray. There was some nausea, no vomiting. She had a migraine last week that was aborted by Zomig. The frequency of daily headaches have decreased as well, she is not having headaches daily anymore. She has stopped daily intake for Excedrin. She is uncomfortable today due to pain from kidney stones and recently went to the ER. She denies any dizziness, diplopia, focal numbness/tingling/weakness, dysarthria/dysphagia, tinnitus unchanged.  HPI: This is a pleasant 29 yo LH woman with a strong family history of migraines (mother, 2 maternal aunts, 2 sisters) and personal history of migraines since age 37 or 38, who presented for headache worsening over the past 4-5 years. She reports that since 2010 or 2011, she has been having a constant daily low grade 1 to 2 over 10 headache. She has migraines 1-2 times a month, starting at the base of her neck, then radiating up diffusely, with pressure-like 10/10 pain with associated nausea, photo and phonophobia, as well as significant sensitivity to smells. She usually has visual changes prior to migraines, such as seeing spots, or her vision briefly blackening then returning to normal. The last time she vomited from migraines was 2 years ago. She took Imitrex and  Fioricet in the past, which did not help. She has been taking Excedrin on a near daily basis (around 6 or 7 doses a week) for the past couple of years. This has helped take the edge off. Poor sleep is also a trigger, she usually gets 5-6 hours of sleep on average. She denies taking any headache preventative medication in the past. She stopped birth control and has had only 2 menstrual periods, both times seemed to have had more migraines. They are trying to get pregnant.   PAST MEDICAL HISTORY: Past Medical History  Diagnosis Date  . GERD (gastroesophageal reflux disease)   . ITP (idiopathic thrombocytopenic purpura)   . Migraine   . Allergy   . Anxiety   . Clotting disorder (HCC)   . Depression     MEDICATIONS: Current Outpatient Prescriptions on File Prior to Visit  Medication Sig Dispense Refill  . cyclobenzaprine (FLEXERIL) 5 MG tablet Take 1 tablet (5 mg total) by mouth 3 (three) times daily as needed for muscle spasms. 12 tablet 0  . ibuprofen (ADVIL,MOTRIN) 800 MG tablet Take 1 tablet (800 mg total) by mouth 3 (three) times daily. 21 tablet 0  . nortriptyline (PAMELOR) 10 MG capsule Take 2 capsules at bedtime. 180 capsule 1  . zolmitriptan (ZOMIG) 5 MG nasal solution Place 1 spray into the nose as needed for migraine. Use at onset of headache, may repeat in 2 hours if needed. Do not use more than 3 times per week. 1 Units 5  . fluticasone (FLONASE) 50 MCG/ACT nasal spray Place into both nostrils daily. Reported on 02/03/2015     No current  facility-administered medications on file prior to visit.    ALLERGIES: Allergies  Allergen Reactions  . Amoxicillin Rash  . Penicillins Hives    FAMILY HISTORY: Family History  Problem Relation Age of Onset  . Hyperlipidemia Maternal Grandmother   . Thyroid disease Mother     SOCIAL HISTORY: Social History   Social History  . Marital Status: Single    Spouse Name: N/A  . Number of Children: N/A  . Years of Education: N/A    Occupational History  . Mortgage Insurance    Social History Main Topics  . Smoking status: Current Every Day Smoker  . Smokeless tobacco: Never Used  . Alcohol Use: 0.0 oz/week    0 Standard drinks or equivalent per week     Comment: Occ  . Drug Use: No  . Sexual Activity: Not on file   Other Topics Concern  . Not on file   Social History Narrative    REVIEW OF SYSTEMS: Constitutional: No fevers, chills, or sweats, no generalized fatigue, change in appetite Eyes: No visual changes, double vision, eye pain Ear, nose and throat: No hearing loss, ear pain, nasal congestion, sore throat Cardiovascular: No chest pain, palpitations Respiratory:  No shortness of breath at rest or with exertion, wheezes GastrointestinaI: No nausea, vomiting, diarrhea, abdominal pain, fecal incontinence Genitourinary:  No dysuria, urinary retention or frequency Musculoskeletal:  No neck pain, back pain Integumentary: No rash, pruritus, skin lesions Neurological: as above Psychiatric: No depression, insomnia, anxiety Endocrine: No palpitations, fatigue, diaphoresis, mood swings, change in appetite, change in weight, increased thirst Hematologic/Lymphatic:  No anemia, purpura, petechiae. Allergic/Immunologic: no itchy/runny eyes, nasal congestion, recent allergic reactions, rashes  PHYSICAL EXAM: Filed Vitals:   02/03/15 1259  BP: 128/70  Pulse: 78   General: No acute distress Head:  Normocephalic/atraumatic Neck: supple, no paraspinal tenderness, full range of motion Heart:  Regular rate and rhythm Lungs:  Clear to auscultation bilaterally Back: No paraspinal tenderness Skin/Extremities: No rash, no edema Neurological Exam: alert and oriented to person, place, and time. No aphasia or dysarthria. Fund of knowledge is appropriate.  Recent and remote memory are intact.  Attention and concentration are normal.    Able to name objects and repeat phrases. Cranial nerves: Pupils equal, round,  reactive to light.  Fundoscopic exam unremarkable, no papilledema. Extraocular movements intact with no nystagmus. Visual fields full. Facial sensation intact. No facial asymmetry. Tongue, uvula, palate midline.  Motor: Bulk and tone normal, muscle strength 5/5 throughout with no pronator drift.  Sensation to light touch, temperature and vibration intact.  No extinction to double simultaneous stimulation.  Deep tendon reflexes 2+ throughout, toes downgoing.  Finger to nose testing intact.  Gait slow and cautious due to abdominal pain from kidney stones.   IMPRESSION: This is a pleasant 30 yo LH woman with a history of migraines since childhood, who presented with daily headaches and migraine exacerbations occurring 1-2 times a month. She had initial good response to nortriptyline with no migraines for 2 months, until last month. She had status migrainosus around Christmas. Daily headaches have improved, she is not taking Excedrin daily anymore. She will increase nortriptyline to  qhs and call us in 3 months, depending on response, we may further increase dose. Continue prn Zomig, she knows to minimize rescue medications to 2-3 a week to avoid rebound headaches. They are planning for pregnancy, we again discussed nortriptyline as Category C, risks and benefits, staying on low dose and discussing with her OB as well.  She will keep a calendar of her headaches and follow-up in 8 months. She knows to call our office for any changes.   Thank you for allowing me to participate in her care.  Please do not hesitate to call for any questions or concerns.  The duration of this appointment visit was 25 minutes of face-to-face time with the patient.  Greater than 50% of this time was spent in counseling, explanation of diagnosis, planning of further management, and coordination of care.   Patrcia Dolly, M.D.

## 2015-03-10 ENCOUNTER — Other Ambulatory Visit: Payer: Self-pay | Admitting: Family Medicine

## 2015-03-10 DIAGNOSIS — G43109 Migraine with aura, not intractable, without status migrainosus: Secondary | ICD-10-CM

## 2015-03-10 MED ORDER — NORTRIPTYLINE HCL 10 MG PO CAPS: ORAL_CAPSULE | ORAL | Status: DC

## 2015-04-01 ENCOUNTER — Telehealth: Payer: Self-pay | Admitting: Neurology

## 2015-04-01 NOTE — Telephone Encounter (Signed)
Medication dosage is doing good but having problem with the recuse medication and ins please call [phone] needs to talk to some one

## 2015-04-01 NOTE — Telephone Encounter (Signed)
Tried to call patient twice. No answer couldn't leave msg phone would cut off once vm started.

## 2015-04-01 NOTE — Telephone Encounter (Signed)
Pt called back. Switched insurances. Zomig nasal spray is ~$370 per month. Pt states that is not feasible. Would like to know if you would prescribe her the zomig pills or another medication that has a generic that might be cheaper. Please advise.    [facility] [address] [place] [place]

## 2015-04-02 MED ORDER — ELETRIPTAN HYDROBROMIDE 20 MG PO TABS: 20.0000 mg | ORAL_TABLET | ORAL | Status: DC | PRN

## 2015-04-02 NOTE — Telephone Encounter (Signed)
Called patient to let her know Rx for Relpax was sent to her pharmacy. She is going to check with the pharmacy to see what her out of pocket cost would be. I told her to call me back if the Rx was too expensive.

## 2015-04-02 NOTE — Telephone Encounter (Signed)
Pls see if Relpax is covered by her insurance: Relpax 20mg  at onset of migraine. #10 with 6 refills. Thanks

## 2015-08-19 ENCOUNTER — Ambulatory Visit (INDEPENDENT_AMBULATORY_CARE_PROVIDER_SITE_OTHER): Payer: Managed Care, Other (non HMO) | Admitting: Family Medicine

## 2015-08-19 VITALS — BP 102/68 | HR 94 | Temp 98.4°F | Resp 18 | Ht 62.5 in | Wt 99.8 lb

## 2015-08-19 DIAGNOSIS — B078 Other viral warts: Secondary | ICD-10-CM | POA: Insufficient documentation

## 2015-08-19 DIAGNOSIS — B079 Viral wart, unspecified: Secondary | ICD-10-CM | POA: Diagnosis not present

## 2015-08-19 NOTE — Progress Notes (Signed)
Patient ID: Misty Bauer, female    DOB: 1985-12-04, 30 y.o.   MRN: [id number]  PCP: No PCP Per Patient  Chief Complaint  Patient presents with  . Other    wart on left hand    Subjective:   HPI Presents for evaluation of wart on her middle finger times several years. She reports home remedies orginally decreased the size of the wart, now the wart is increasing in size. It has gotten larger and is interfering with writing.  . Social History   Social History  . Marital status: Single    Spouse name: N/A  . Number of children: N/A  . Years of education: N/A   Occupational History  . Mortgage Insurance    Social History Main Topics  . Smoking status: Current Every Day Smoker  . Smokeless tobacco: Never Used  . Alcohol use 0.0 oz/week     Comment: Occ  . Drug use: No  . Sexual activity: Not on file   Other Topics Concern  . Not on file   Social History Narrative  . No narrative on file   . Family History  Problem Relation Age of Onset  . Thyroid disease Mother   . Hyperlipidemia Maternal Grandmother     Review of Systems  Constitutional: Negative.   Respiratory: Negative.   Cardiovascular: Negative.   Skin:       SEE HPI Denies any other warts on body    Patient Active Problem List   Diagnosis Date Noted  . Migraine with aura and without status migrainosus, not intractable 10/09/2014  . Worsening headaches 10/09/2014     Prior to Admission medications   Medication Sig Start Date End Date Taking? Authorizing Provider  eletriptan (RELPAX) 20 MG tablet Take 1 tablet (20 mg total) by mouth as needed for migraine or headache. May repeat in 2 hours if headache persists or recurs. Do not use more than 3 times per week. 04/02/15  Yes Van ClinesKaren M Aquino, MD  fluticasone [facility](FLONASE) 50 MCG/ACT nasal spray Place into both nostrils daily. Reported on 02/03/2015   Yes Historical Provider, MD  nortriptyline (PAMELOR) 10 MG capsule Take 3 capsules at bedtime. 03/10/15   Yes Van ClinesKaren M Aquino, MD  cyclobenzaprine (FLEXERIL) 5 MG tablet Take 1 tablet (5 mg total) by mouth 3 (three) times daily as needed for muscle spasms. Patient not taking: Reported on 08/19/2015 02/01/15   Cheri FowlerKayla Rose, PA-C     Allergies  Allergen Reactions  . Amoxicillin Rash  . Penicillins Hives       Objective:  Physical Exam  Constitutional: She appears well-developed and well-nourished.  HENT:  Head: Normocephalic and atraumatic.  Eyes: Conjunctivae are normal. Pupils are equal, round, and reactive to light.  Neck: Normal range of motion. Neck supple.  Cardiovascular: Normal rate.   Pulmonary/Chest: Effort normal.  Neurological: She is alert.  Skin: Skin is warm and dry.  Two adjoining warts on the upper left third digit.    Assessment & Plan:  1. Cutaneous Warts-Two warts located lateral upper portion of the left third digit.    Plan: Informed consent was obtained. The wart was treated with with cryotherapy using liquid nitrogen applied x 3 times to the core of ear wart. The color changed to white with the application of acid. The patient tolerated procedure well and aftercare instructions were given to the patient.  After care handout given.  Godfrey PickKimberly S. Tiburcio PeaHarris, MSN, FNP-C [facility]

## 2015-08-19 NOTE — Patient Instructions (Addendum)
IF you received an x-ray today, you will receive an invoice from [facility]. Please contact [facility] at [phone] with questions or concerns regarding your invoice.   IF you received labwork today, you will receive an invoice from [facility]. Please contact [facility] at [phone] with questions or concerns regarding your invoice.   Our billing staff will not be able to assist you with questions regarding bills from these companies.  You will be contacted with the lab results as soon as they are available. The fastest way to get your results is to activate your My Chart account. Instructions are located on the last page of this paperwork. If you have not heard from us regarding the results in 2 weeks, please contact this office.     Warts Warts are small growths on the skin. They are common, and they are caused by a type of germ (virus). Warts can occur on many areas of the body. A person may have one wart or more than one wart. Warts can spread if you scratch a wart and then scratch normal skin. Most warts will go away over many months to a couple years. Treatments may be done if needed. HOME CARE  Apply over-the-counter and prescription medicines only as told by your doctor.  Do not apply over-the-counter wart medicines to your face or genitals before you ask your doctor if it is okay to do that.  Do not scratch or pick at a wart.  Wash your hands after you touch a wart.  Avoid shaving hair that is over a wart.  Keep all follow-up visits as told by your doctor. This is important. GET HELP IF:  Your warts do not improve after treatment.  You have redness, swelling, or pain at the site of a wart.  You have bleeding from a wart, and the bleeding does not stop when you put light pressure on the wart.  You have diabetes and you get a wart.   This information is not intended to replace advice given to you by your health care  provider. Make sure you discuss any questions you have with your health care provider.   Document Released: 04/28/2010 Document Revised: 09/16/2014 Document Reviewed: 03/23/2014 Elsevier Interactive Patient Education 2016 Elsevier Inc.   Cryosurgery for Skin Conditions   Cryosurgery involves using cold to freeze and remove damaged skin or growths on the skin. It is also called cryotherapy. It is often used to remove warts or remove growths that could turn into cancer.  BEFORE THE PROCEDURE You do not need to do anything to prepare for this procedure. PROCEDURE  Cryosurgery takes a few minutes. It can be done in your doctor's office. There are different ways to perform the procedure.   Your doctor may use a tool (probe) with cold fluid (liquid nitrogen) flowing through it. The tool is placed on the skin. The tool freezes the skin that needs to be removed.  Your doctor may spray cold fluid directly on the skin. The spray freezes the skin that needs to be removed. AFTER THE PROCEDURE  Your treated skin will be red and puffy (swollen). This is normal.  You will be told to keep the area clean and covered with a bandage.  You will be able to go home soon after the procedure.  You may need the procedure again if the growth comes back.   This information is not intended to replace advice given to you by your health  care provider. Make sure you discuss any questions you have with your health care provider.   Document Released: 03/20/2011 Document Revised: 12/31/2012 Document Reviewed: 07/26/2012 Elsevier Interactive Patient Education [employer]

## 2015-09-07 ENCOUNTER — Ambulatory Visit (INDEPENDENT_AMBULATORY_CARE_PROVIDER_SITE_OTHER): Payer: Managed Care, Other (non HMO) | Admitting: Physician Assistant

## 2015-09-07 VITALS — BP 110/74 | HR 92 | Temp 98.2°F | Resp 17 | Ht 62.5 in | Wt 101.0 lb

## 2015-09-07 DIAGNOSIS — A09 Infectious gastroenteritis and colitis, unspecified: Secondary | ICD-10-CM | POA: Diagnosis not present

## 2015-09-07 DIAGNOSIS — R197 Diarrhea, unspecified: Secondary | ICD-10-CM

## 2015-09-07 DIAGNOSIS — R1084 Generalized abdominal pain: Secondary | ICD-10-CM | POA: Diagnosis not present

## 2015-09-07 LAB — POC MICROSCOPIC URINALYSIS (UMFC): MUCUS RE: ABSENT

## 2015-09-07 LAB — POCT URINALYSIS DIP (MANUAL ENTRY)
BILIRUBIN UA: NEGATIVE
BILIRUBIN UA: NEGATIVE
Glucose, UA: NEGATIVE
LEUKOCYTES UA: NEGATIVE
Nitrite, UA: NEGATIVE
Protein Ur, POC: NEGATIVE
RBC UA: NEGATIVE
Spec Grav, UA: 1.015
Urobilinogen, UA: 0.2
pH, UA: 7

## 2015-09-07 LAB — POCT CBC
GRANULOCYTE PERCENT: 63.6 % (ref 37–80)
HCT, POC: 37 % — AB (ref 37.7–47.9)
Hemoglobin: 12.9 g/dL (ref 12.2–16.2)
Lymph, poc: 2.2 (ref 0.6–3.4)
MCH, POC: 31.2 pg (ref 27–31.2)
MCHC: 35 g/dL (ref 31.8–35.4)
MCV: 89.1 fL (ref 80–97)
MID (CBC): 0.7 (ref 0–0.9)
MPV: 8.3 fL (ref 0–99.8)
POC Granulocyte: 5.2 (ref 2–6.9)
POC LYMPH %: 27.7 % (ref 10–50)
POC MID %: 8.7 %M (ref 0–12)
Platelet Count, POC: 211 10*3/uL (ref 142–424)
RBC: 4.15 M/uL (ref 4.04–5.48)
RDW, POC: 13.7 %
WBC: 8.1 10*3/uL (ref 4.6–10.2)

## 2015-09-07 LAB — POCT URINE PREGNANCY: Preg Test, Ur: NEGATIVE

## 2015-09-07 MED ORDER — RANITIDINE HCL 150 MG PO TABS: 150.0000 mg | ORAL_TABLET | Freq: Two times a day (BID) | ORAL | 0 refills | Status: DC

## 2015-09-07 NOTE — Patient Instructions (Addendum)
If your diarrhea is present in 10 days then please return for a stool culture.  I have placed a future order and I will not need to see you to order this test.    Take ranitidine for reflux symptoms (heart burn, cough) in the morning and night.   If you need to stop the diarrhea then purchase some immodium AD and take as outlined on the package.    IF you received an x-ray today, you will receive an invoice from [facility]. Please contact [facility] at [phone] with questions or concerns regarding your invoice.   IF you received labwork today, you will receive an invoice from [facility]. Please contact [facility] at [phone] with questions or concerns regarding your invoice.   Our billing staff will not be able to assist you with questions regarding bills from these companies.  You will be contacted with the lab results as soon as they are available. The fastest way to get your results is to activate your My Chart account. Instructions are located on the last page of this paperwork. If you have not heard from us regarding the results in 2 weeks, please contact this office.

## 2015-09-07 NOTE — Progress Notes (Signed)
09/07/2015 4:02 PM   DOB: Jun 06, 1985 / MRN: [id number]  SUBJECTIVE:  Misty Bauer is a 30 y.o. female presenting for "crampy" lower abdominal quadrant pain that started 4 days ago.  She associates non bloody diarrhea. She has taken Tum's and this did not help.  Stated having some reflux like symptoms today.  She is sexually active with men and reports pregnancy is possible. Denies nausea, fever, chills, and has a relatively normal appetite.   She is allergic to amoxicillin and penicillins.   She  has a past medical history of Allergy; Anxiety; Clotting disorder (HCC); Depression; GERD (gastroesophageal reflux disease); ITP (idiopathic thrombocytopenic purpura); and Migraine.    She  reports that she has been smoking.  She has never used smokeless tobacco. She reports that she drinks alcohol. She reports that she does not use drugs. She  has no sexual activity history on file. The patient  has a past surgical history that includes Wisdom tooth extraction (2014).  Her family history includes Hyperlipidemia in her maternal grandmother; Thyroid disease in her mother.  Review of Systems  Constitutional: Negative for chills and fever.  Respiratory: Negative for cough.   Cardiovascular: Negative for chest pain.  Gastrointestinal: Positive for abdominal pain, diarrhea and heartburn. Negative for blood in stool, constipation, melena, nausea and vomiting.  Genitourinary: Positive for frequency. Negative for dysuria and urgency.  Skin: Negative for itching and rash.  Neurological: Negative for dizziness and headaches.    The problem list and medications were reviewed and updated by myself where necessary and exist elsewhere in the encounter.   OBJECTIVE:  BP 110/74 (BP Location: Right Arm, Patient Position: Sitting, Cuff Size: Normal)   Pulse 92   Temp 98.2 F (36.8 C) (Oral)   Resp 17   Ht 5' 2.5" (1.588 m)   Wt 101 lb (45.8 kg)   LMP 08/10/2015   SpO2 100%   BMI 18.18 kg/m    Physical Exam  Constitutional: She is oriented to person, place, and time. Vital signs are normal. She does not appear ill.  Cardiovascular: Normal rate, regular rhythm and normal heart sounds.   Pulmonary/Chest: Effort normal and breath sounds normal.  Abdominal: Soft. Normal appearance. She exhibits no mass. Bowel sounds are increased. There is no tenderness. There is no rigidity, no rebound, no guarding, no CVA tenderness, no tenderness at McBurney's point and negative Murphy's sign.  Musculoskeletal: Normal range of motion.  Neurological: She is alert and oriented to person, place, and time.  Vitals reviewed.   Results for orders placed or performed in visit on 09/07/15 (from the past 72 hour(s))  POCT urine pregnancy     Status: None   Collection Time: 09/07/15  3:11 PM  Result Value Ref Range   Preg Test, Ur Negative Negative  POCT Microscopic Urinalysis ([facility])     Status: Abnormal   Collection Time: 09/07/15  3:11 PM  Result Value Ref Range   WBC,UR,HPF,POC Few (A) None WBC/hpf   RBC,UR,HPF,POC None None RBC/hpf   Bacteria Many (A) None, Too numerous to count   Mucus Absent Absent   Epithelial Cells, UR Per Microscopy Moderate (A) None, Too numerous to count cells/hpf  POCT CBC     Status: Abnormal   Collection Time: 09/07/15  3:18 PM  Result Value Ref Range   WBC 8.1 4.6 - 10.2 K/uL   Lymph, poc 2.2 0.6 - 3.4   POC LYMPH PERCENT 27.7 10 - 50 %L   MID (cbc) 0.7 0 -  0.9   POC MID % 8.7 0 - 12 %M   POC Granulocyte 5.2 2 - 6.9   Granulocyte percent 63.6 37 - 80 %G   RBC 4.15 4.04 - 5.48 M/uL   Hemoglobin 12.9 12.2 - 16.2 g/dL   HCT, POC 16.1 (A) 09.6 - 47.9 %   MCV 89.1 80 - 97 fL   MCH, POC 31.2 27 - 31.2 pg   MCHC 35.0 31.8 - 35.4 g/dL   RDW, POC 04.5 %   Platelet Count, POC 211 142 - 424 K/uL   MPV 8.3 0 - 99.8 fL  POCT urinalysis dipstick     Status: None   Collection Time: 09/07/15  3:25 PM  Result Value Ref Range   Color, UA yellow yellow   Clarity, UA  clear clear   Glucose, UA negative negative   Bilirubin, UA negative negative   Ketones, POC UA negative negative   Spec Grav, UA 1.015    Blood, UA negative negative   pH, UA 7.0    Protein Ur, POC negative negative   Urobilinogen, UA 0.2    Nitrite, UA Negative Negative   Leukocytes, UA Negative Negative    No results found.  ASSESSMENT AND PLAN  Clea was seen today for abdominal pain and diarrhea.  Diagnoses and all orders for this visit:  Generalized abdominal pain -     POCT urine pregnancy -     POCT Microscopic Urinalysis ([facility]) -     ranitidine (ZANTAC) 150 MG tablet; Take 1 tablet (150 mg total) by mouth 2 (two) times daily. -     POCT urinalysis dipstick  Diarrhea of presumed infectious origin -     POCT CBC -     Gastrointestinal Pathogen Panel PCR; Future    The patient is advised to call or return to clinic if she does not see an improvement in symptoms, or to seek the care of the closest emergency department if she worsens with the above plan.   Deliah Boston, MHS, PA-C [facility] 09/07/2015 4:02 PM

## 2015-10-04 ENCOUNTER — Encounter: Payer: Self-pay | Admitting: Neurology

## 2015-10-04 ENCOUNTER — Ambulatory Visit (INDEPENDENT_AMBULATORY_CARE_PROVIDER_SITE_OTHER): Payer: Managed Care, Other (non HMO) | Admitting: Neurology

## 2015-10-04 VITALS — BP 108/66 | HR 77 | Temp 98.1°F | Ht 62.5 in | Wt 101.1 lb

## 2015-10-04 DIAGNOSIS — G43109 Migraine with aura, not intractable, without status migrainosus: Secondary | ICD-10-CM | POA: Diagnosis not present

## 2015-10-04 MED ORDER — NORTRIPTYLINE HCL 10 MG PO CAPS
ORAL_CAPSULE | ORAL | 3 refills | Status: DC
Start: 2015-10-04 — End: 2015-11-22

## 2015-10-04 MED ORDER — PROCHLORPERAZINE MALEATE 5 MG PO TABS: ORAL_TABLET | ORAL | 6 refills | Status: DC

## 2015-10-04 MED ORDER — ELETRIPTAN HYDROBROMIDE 20 MG PO TABS: 20.0000 mg | ORAL_TABLET | ORAL | 6 refills | Status: DC | PRN

## 2015-10-04 NOTE — Progress Notes (Signed)
NEUROLOGY FOLLOW UP OFFICE NOTE  Misty Bauer [id number]  HISTORY OF PRESENT ILLNESS: I had the pleasure of seeing Misty Bauer in follow-up in the neurology clinic on 10/04/2015.  The patient was last seen 8 months ago for worsening headaches with daily mild headaches and migraine exacerbations 1-2 times a month. Dose of nortriptyline was increased to 30mg  qhs on her last visit, and she reports doing better with no headaches since August. She did have a bad headache at that time lasting 3 days. She reports a migraine at the end of July that lasted 8 hours and was "pretty bad" with nausea and vomiting. She has had several minor headaches, mostly when she wakes up, usually due to sleep deprivation. She usually gets 5-1/2 hours of sleep. She denies any side effects on nortriptyline. She takes Relpax for rescue with good effect. Zomig was cost-prohibitive on her insurance plan. She denies any dizziness, diplopia, focal numbness/tingling/weakness, dysarthria/dysphagia, tinnitus unchanged.  HPI: This is a pleasant 30 yo LH woman with a strong family history of migraines (mother, 2 maternal aunts, 2 sisters) and personal history of migraines since age 89 or 21, who presented for headache worsening over the past 4-5 years. She reports that since 2010 or 2011, she has been having a constant daily low grade 1 to 2 over 10 headache. She has migraines 1-2 times a month, starting at the base of her neck, then radiating up diffusely, with pressure-like 10/10 pain with associated nausea, photo and phonophobia, as well as significant sensitivity to smells. She usually has visual changes prior to migraines, such as seeing spots, or her vision briefly blackening then returning to normal. The last time she vomited from migraines was 2 years ago. She took Imitrex and Fioricet in the past, which did not help. She has been taking Excedrin on a near daily basis (around 6 or 7 doses a week) for the past couple of years.  This has helped take the edge off. Poor sleep is also a trigger, she usually gets 5-6 hours of sleep on average. She denies taking any headache preventative medication in the past. She stopped birth control and has had only 2 menstrual periods, both times seemed to have had more migraines. They are trying to get pregnant.   PAST MEDICAL HISTORY: Past Medical History:  Diagnosis Date  . Allergy   . Anxiety   . Clotting disorder (HCC)   . Depression   . GERD (gastroesophageal reflux disease)   . ITP (idiopathic thrombocytopenic purpura)   . Migraine     MEDICATIONS: Current Outpatient Prescriptions on File Prior to Visit  Medication Sig Dispense Refill  . fluticasone (FLONASE) 50 MCG/ACT nasal spray Place into both nostrils daily. Reported on 02/03/2015    . nortriptyline (PAMELOR) 10 MG capsule Take 3 capsules at bedtime. 270 capsule 3  . eletriptan (RELPAX) 20 MG tablet Take 1 tablet (20 mg total) by mouth as needed for migraine or headache. May repeat in 2 hours if headache persists or recurs. Do not use more than 3 times per week. (Patient not taking: Reported on 10/04/2015) 10 tablet 6  . ranitidine (ZANTAC) 150 MG tablet Take 1 tablet (150 mg total) by mouth 2 (two) times daily. (Patient not taking: Reported on 10/04/2015) 15 tablet 0   No current facility-administered medications on file prior to visit.     ALLERGIES: Allergies  Allergen Reactions  . Amoxicillin Rash  . Penicillins Hives    FAMILY HISTORY: Family History  Problem  Relation Age of Onset  . Thyroid disease Mother   . Hyperlipidemia Maternal Grandmother     SOCIAL HISTORY: Social History   Social History  . Marital status: Single    Spouse name: N/A  . Number of children: N/A  . Years of education: N/A   Occupational History  . Mortgage Insurance    Social History Main Topics  . Smoking status: Current Every Day Smoker  . Smokeless tobacco: Never Used  . Alcohol use 0.0 oz/week     Comment: Occ    . Drug use: No  . Sexual activity: Not on file   Other Topics Concern  . Not on file   Social History Narrative  . No narrative on file    REVIEW OF SYSTEMS: Constitutional: No fevers, chills, or sweats, no generalized fatigue, change in appetite Eyes: No visual changes, double vision, eye pain Ear, nose and throat: No hearing loss, ear pain, nasal congestion, sore throat Cardiovascular: No chest pain, palpitations Respiratory:  No shortness of breath at rest or with exertion, wheezes GastrointestinaI: No nausea, vomiting, diarrhea, abdominal pain, fecal incontinence Genitourinary:  No dysuria, urinary retention or frequency Musculoskeletal:  No neck pain, back pain Integumentary: No rash, pruritus, skin lesions Neurological: as above Psychiatric: No depression, insomnia, anxiety Endocrine: No palpitations, fatigue, diaphoresis, mood swings, change in appetite, change in weight, increased thirst Hematologic/Lymphatic:  No anemia, purpura, petechiae. Allergic/Immunologic: no itchy/runny eyes, nasal congestion, recent allergic reactions, rashes  PHYSICAL EXAM: Vitals:   10/04/15 1605  BP: 108/66  Pulse: 77  Temp: 98.1 F (36.7 C)   General: No acute distress Head:  Normocephalic/atraumatic Neck: supple, no paraspinal tenderness, full range of motion Heart:  Regular rate and rhythm Lungs:  Clear to auscultation bilaterally Back: No paraspinal tenderness Skin/Extremities: No rash, no edema Neurological Exam: alert and oriented to person, place, and time. No aphasia or dysarthria. Fund of knowledge is appropriate.  Recent and remote memory are intact.  Attention and concentration are normal.    Able to name objects and repeat phrases. Cranial nerves: Pupils equal, round, reactive to light.  Fundoscopic exam unremarkable, no papilledema. Extraocular movements intact with no nystagmus. Visual fields full. Facial sensation intact. No facial asymmetry. Tongue, uvula, palate midline.   Motor: Bulk and tone normal, muscle strength 5/5 throughout with no pronator drift.  Sensation to light touch, temperature and vibration intact.  No extinction to double simultaneous stimulation.  Deep tendon reflexes 2+ throughout, toes downgoing.  Finger to nose testing intact.  Gait slow and cautious due to abdominal pain from kidney stones.   IMPRESSION: This is a pleasant 30 yo LH woman with a history of migraines since childhood, who presented with daily headaches and migraine exacerbations occurring 1-2 times a month. She had initial good response to nortriptyline but continues to report migraines on current dose. She will increase dose to 40mg  qhs. She has Relpax for rescue, and will be given a prescription for compazine for nausea/vomiting accompanying her migraines. SHe will continue to keep a calendar of her headaches and follow-up in 6 months. She knows to call our office for any changes.   Thank you for allowing me to participate in her care.  Please do not hesitate to call for any questions or concerns.  The duration of this appointment visit was 25 minutes of face-to-face time with the patient.  Greater than 50% of this time was spent in counseling, explanation of diagnosis, planning of further management, and coordination of care.  Ellouise Newer, M.D.

## 2015-10-04 NOTE — Patient Instructions (Addendum)
1. Increase nortriptyline 10mg : Take 4 capsules daily 2. Take Relpax 20mg  at onset of migraine, may take another tablet 2 hours after. Do not take more than 3 in a week 3. Take compazine 5mg  as needed for nausea/vomiting 4. Keep a calendar of your migraines 5. Follow-up in 6 months

## 2015-10-17 ENCOUNTER — Encounter: Payer: Self-pay | Admitting: Neurology

## 2015-11-22 ENCOUNTER — Other Ambulatory Visit: Payer: Self-pay

## 2015-11-22 ENCOUNTER — Encounter: Payer: Self-pay | Admitting: Neurology

## 2015-11-22 DIAGNOSIS — G43109 Migraine with aura, not intractable, without status migrainosus: Secondary | ICD-10-CM

## 2015-11-22 MED ORDER — NORTRIPTYLINE HCL 10 MG PO CAPS: ORAL_CAPSULE | ORAL | 4 refills | Status: DC

## 2015-11-22 NOTE — Telephone Encounter (Signed)
Nortriptyline refill sent to [facility] per patient request.

## 2016-02-15 ENCOUNTER — Other Ambulatory Visit: Payer: Self-pay | Admitting: Sports Medicine

## 2016-02-15 DIAGNOSIS — M25562 Pain in left knee: Secondary | ICD-10-CM

## 2016-02-24 ENCOUNTER — Inpatient Hospital Stay
Admission: RE | Admit: 2016-02-24 | Discharge: 2016-02-24 | Disposition: A | Discharge status: Home / Self Care | Payer: Managed Care, Other (non HMO) | Source: Ambulatory Visit | Attending: Sports Medicine | Admitting: Sports Medicine

## 2016-04-18 ENCOUNTER — Encounter: Payer: Self-pay | Admitting: Neurology

## 2016-04-18 ENCOUNTER — Ambulatory Visit (INDEPENDENT_AMBULATORY_CARE_PROVIDER_SITE_OTHER): Payer: 59 | Admitting: Neurology

## 2016-04-18 VITALS — BP 110/68 | HR 81 | Temp 98.2°F | Resp 16 | Ht 62.25 in | Wt 99.4 lb

## 2016-04-18 DIAGNOSIS — G43109 Migraine with aura, not intractable, without status migrainosus: Secondary | ICD-10-CM

## 2016-04-18 MED ORDER — ELETRIPTAN HYDROBROMIDE 20 MG PO TABS: 20.0000 mg | ORAL_TABLET | ORAL | 11 refills | Status: DC | PRN

## 2016-04-18 MED ORDER — NORTRIPTYLINE HCL 10 MG PO CAPS: ORAL_CAPSULE | ORAL | 3 refills | Status: DC

## 2016-04-18 NOTE — Progress Notes (Signed)
NEUROLOGY FOLLOW UP OFFICE NOTE  Misty Bauer [id number]  HISTORY OF PRESENT ILLNESS: I had the pleasure of seeing Misty Bauer in follow-up in the neurology clinic on 04/18/2016.  The patient was last seen 7 months ago for worsening headaches with daily mild headaches and migraine exacerbations 1-2 times a month. Dose of nortriptyline was increased to  qhs on her last visit, and she reports doing well, she has only had 3 migraines since her last visit and a couple of milder headaches.  She takes prn Relpax with good effect. She denies any side effects to nortriptyline. She has had stress recently with a miscarriage and moving homes, but states that when she does sleep, it is more recuperative. She gets around 5.5 hours of sleep. Since the move, she has had constant tingling on the left side of her neck, denies any neck pain. No focal weakness, no bowel/bladder dysfunction. She denies any falls.  HPI: This is a pleasant 31 yo LH woman with a strong family history of migraines (mother, 2 maternal aunts, 2 sisters) and personal history of migraines since age 31 or 31, who presented for headache worsening over the past 4-5 years. She reports that since 2010 or 2011, she has been having a constant daily low grade 1 to 2 over 10 headache. She has migraines 1-2 times a month, starting at the base of her neck, then radiating up diffusely, with pressure-like 10/10 pain with associated nausea, photo and phonophobia, as well as significant sensitivity to smells. She usually has visual changes prior to migraines, such as seeing spots, or her vision briefly blackening then returning to normal. The last time she vomited from migraines was 2 years ago. She took Imitrex and Fioricet in the past, which did not help. She has been taking Excedrin on a near daily basis (around 6 or 7 doses a week) for the past couple of years. This has helped take the edge off. Poor sleep is also a trigger, she usually gets 5-6  hours of sleep on average. She denies taking any headache preventative medication in the past. She stopped birth control and has had only 2 menstrual periods, both times seemed to have had more migraines. They are trying to get pregnant.   PAST MEDICAL HISTORY: Past Medical History:  Diagnosis Date  . Allergy   . Anxiety   . Clotting disorder (HCC)   . Depression   . GERD (gastroesophageal reflux disease)   . ITP (idiopathic thrombocytopenic purpura)   . Migraine     MEDICATIONS: Current Outpatient Prescriptions on File Prior to Visit  Medication Sig Dispense Refill  . eletriptan (RELPAX) 20 MG tablet Take 1 tablet (20 mg total) by mouth as needed for migraine or headache. May repeat in 2 hours if headache persists or recurs. Do not use more than 3 times per week. 10 tablet 6  . fluticasone (FLONASE) 50 MCG/ACT nasal spray Place into both nostrils daily. Reported on 02/03/2015    . nortriptyline (PAMELOR) 10 MG capsule Take 4 capsules at bedtime. 120 capsule 4  . prochlorperazine (COMPAZINE) 5 MG tablet Take 1 tablet every 12 hours as needed for nausea/vomiting 10 tablet 6  . ranitidine (ZANTAC) 150 MG tablet Take 1 tablet (150 mg total) by mouth 2 (two) times daily. (Patient not taking: Reported on 10/04/2015) 15 tablet 0   No current facility-administered medications on file prior to visit.     ALLERGIES: Allergies  Allergen Reactions  . Amoxicillin Rash  . Penicillins  Hives    FAMILY HISTORY: Family History  Problem Relation Age of Onset  . Thyroid disease Mother   . Hyperlipidemia Maternal Grandmother     SOCIAL HISTORY: Social History   Social History  . Marital status: Single    Spouse name: N/A  . Number of children: N/A  . Years of education: N/A   Occupational History  . Mortgage Insurance    Social History Main Topics  . Smoking status: Current Every Day Smoker  . Smokeless tobacco: Never Used  . Alcohol use 0.0 oz/week     Comment: Occ  . Drug use: No   . Sexual activity: Not on file   Other Topics Concern  . Not on file   Social History Narrative  . No narrative on file    REVIEW OF SYSTEMS: Constitutional: No fevers, chills, or sweats, no generalized fatigue, change in appetite Eyes: No visual changes, double vision, eye pain Ear, nose and throat: No hearing loss, ear pain, nasal congestion, sore throat Cardiovascular: No chest pain, palpitations Respiratory:  No shortness of breath at rest or with exertion, wheezes GastrointestinaI: No nausea, vomiting, diarrhea, abdominal pain, fecal incontinence Genitourinary:  No dysuria, urinary retention or frequency Musculoskeletal:  No neck pain, back pain Integumentary: No rash, pruritus, skin lesions Neurological: as above Psychiatric: No depression, insomnia, anxiety Endocrine: No palpitations, fatigue, diaphoresis, mood swings, change in appetite, change in weight, increased thirst Hematologic/Lymphatic:  No anemia, purpura, petechiae. Allergic/Immunologic: no itchy/runny eyes, nasal congestion, recent allergic reactions, rashes  PHYSICAL EXAM: Vitals:   04/18/16 1621  BP: 110/68  Pulse: 81  Resp: 16  Temp: 98.2 F (36.8 C)   General: No acute distress Head:  Normocephalic/atraumatic Neck: supple, no paraspinal tenderness, full range of motion Heart:  Regular rate and rhythm Lungs:  Clear to auscultation bilaterally Back: No paraspinal tenderness Skin/Extremities: No rash, no edema Neurological Exam: alert and oriented to person, place, and time. No aphasia or dysarthria. Fund of knowledge is appropriate.  Recent and remote memory are intact.  Attention and concentration are normal.    Able to name objects and repeat phrases. Cranial nerves: Pupils equal, round, reactive to light.  Fundoscopic exam unremarkable, no papilledema. Extraocular movements intact with no nystagmus. Visual fields full. Facial sensation intact. No facial asymmetry. Tongue, uvula, palate midline.   Motor: Bulk and tone normal, muscle strength 5/5 throughout with no pronator drift.  Sensation to light touch, temperature and vibration intact.  No extinction to double simultaneous stimulation.  Deep tendon reflexes 2+ throughout, toes downgoing.  Finger to nose testing intact.  Gait narrow-based and steady, able to tandem walk adequately.  IMPRESSION: This is a pleasant 31 yo LH woman with a history of migraines since childhood, who presented with daily headaches and migraine exacerbations occurring 1-2 times a month. She has had an excellent response to nortriptyline  qhs, with only 3 migraines in the past 7 months. She takes prn Relpax for rescue. She will continue to keep a calendar of her headaches and follow-up in 1 year. She knows to call our office for any changes.   Thank you for allowing me to participate in her care.  Please do not hesitate to call for any questions or concerns.  The duration of this appointment visit was 15 minutes of face-to-face time with the patient.  Greater than 50% of this time was spent in counseling, explanation of diagnosis, planning of further management, and coordination of care.   Patrcia Dolly, M.D.

## 2016-04-18 NOTE — Patient Instructions (Signed)
1. Continue nortriptyline  at bedtime 2. Take Relpax as needed for migraine 3. Follow-up in 1 year, call for any problems

## 2016-04-23 ENCOUNTER — Encounter: Payer: Self-pay | Admitting: Neurology

## 2016-04-28 ENCOUNTER — Encounter (HOSPITAL_COMMUNITY): Payer: Self-pay | Admitting: *Deleted

## 2016-05-02 ENCOUNTER — Other Ambulatory Visit: Payer: Self-pay | Admitting: Obstetrics and Gynecology

## 2016-05-02 DIAGNOSIS — Z419 Encounter for procedure for purposes other than remedying health state, unspecified: Secondary | ICD-10-CM

## 2016-05-02 NOTE — H&P (Signed)
NAME:  Misty Bauer, JAREMA NO.:  [id number]  MEDICAL RECORD NO.:  [id number]  LOCATION:  [facility]                         FACILITY:  [facility]  PHYSICIAN:  Lenoard Aden, M.D.DATE OF BIRTH:  [birth year]  DATE OF ADMISSION:  04/28/2016 DATE OF DISCHARGE:                             HISTORY & PHYSICAL   CHIEF COMPLAINT:  Abnormal uterine bleeding with retained products of conception.  HISTORY OF PRESENT ILLNESS:  A 31 year old white female, G1, P0, who presents with retained products and refractory bleeding after spontaneous resolution of hCG for definitive ultrasound guided aspiration of products of conception, which appeared to be retained by ultrasound.  ALLERGIES:  She has allergies to penicillin.  MEDICATIONS: 1. Doxycycline. 2. Xanax. 3. Nortriptyline. 4. Relpax. 5. Zyrtec.  FAMILY HISTORY:  Fibromyalgia, endometriosis, and severe dysplasia.  PAST SURGICAL HISTORY:  Wisdom tooth extraction.  PHYSICAL EXAMINATION:  GENERAL:  Well-developed, well-nourished white female in no acute distress. HEENT:  Normal. NECK:  Supple.  Full range of motion. LUNGS: Clear. HEART:  Regular rhythm. ABDOMEN:  Soft, nontender. PELVIS:  Reveals tender mid positioned anteflexed uterus.  No adnexal masses. EXTREMITIES:  No cords. NEUROLOGIC:  Nonfocal. SKIN:  Intact.  DIAGNOSTIC DATA:  Ultrasound confirms echogenic tissue with an endometrial thickness of 10 mm and vascular flow to the endometrium consistent with retained products of conception.  IMPRESSION:  Retained products of conception status post spontaneous abortion/incomplete abortion.  PLAN:  Proceed with a sonogram-guided suction D and A.  Risks of anesthesia, infection, bleeding, injury to surrounding organs, possible need for repair discussed, delayed versus immediate complications to include bowel and bladder injury noted.  The patient acknowledges and wishes to proceed.     Lenoard Aden,  M.D.     RJT/MEDQ  D:  05/02/2016  T:  05/02/2016  Job:  [id number]

## 2016-05-03 ENCOUNTER — Ambulatory Visit (HOSPITAL_COMMUNITY): Payer: 59

## 2016-05-03 ENCOUNTER — Ambulatory Visit (HOSPITAL_COMMUNITY): Payer: 59 | Admitting: Anesthesiology

## 2016-05-03 ENCOUNTER — Ambulatory Visit (HOSPITAL_COMMUNITY)
Admission: RE | Admit: 2016-05-03 | Discharge: 2016-05-03 | Disposition: A | Discharge status: Home / Self Care | Payer: 59 | Source: Ambulatory Visit | Attending: Obstetrics and Gynecology | Admitting: Obstetrics and Gynecology

## 2016-05-03 ENCOUNTER — Encounter (HOSPITAL_COMMUNITY): Payer: Self-pay

## 2016-05-03 ENCOUNTER — Encounter (HOSPITAL_COMMUNITY)
Admission: RE | Disposition: A | Discharge status: Home / Self Care | Payer: Self-pay | Source: Ambulatory Visit | Attending: Obstetrics and Gynecology

## 2016-05-03 DIAGNOSIS — O029 Abnormal product of conception, unspecified: Secondary | ICD-10-CM

## 2016-05-03 DIAGNOSIS — Z880 Allergy status to penicillin: Secondary | ICD-10-CM | POA: Diagnosis not present

## 2016-05-03 DIAGNOSIS — O034 Incomplete spontaneous abortion without complication: Secondary | ICD-10-CM | POA: Insufficient documentation

## 2016-05-03 DIAGNOSIS — Z79899 Other long term (current) drug therapy: Secondary | ICD-10-CM | POA: Insufficient documentation

## 2016-05-03 HISTORY — DX: Personal history of urinary calculi: Z87.442

## 2016-05-03 HISTORY — PX: DILATION AND EVACUATION: SHX1459

## 2016-05-03 LAB — TYPE AND SCREEN
ABO/RH(D): O POS
Antibody Screen: NEGATIVE

## 2016-05-03 LAB — CBC
HCT: 37.1 % (ref 36.0–46.0)
Hemoglobin: 12.6 g/dL (ref 12.0–15.0)
MCH: 30.7 pg (ref 26.0–34.0)
MCHC: 34 g/dL (ref 30.0–36.0)
MCV: 90.5 fL (ref 78.0–100.0)
PLATELETS: 263 10*3/uL (ref 150–400)
RBC: 4.1 MIL/uL (ref 3.87–5.11)
RDW: 13.3 % (ref 11.5–15.5)
WBC: 5.5 10*3/uL (ref 4.0–10.5)

## 2016-05-03 LAB — ABO/RH: ABO/RH(D): O POS

## 2016-05-03 SURGERY — DILATION AND EVACUATION, UTERUS
Anesthesia: Monitor Anesthesia Care | Site: Vagina

## 2016-05-03 MED ORDER — FENTANYL CITRATE (PF) 100 MCG/2ML IJ SOLN
INTRAMUSCULAR | Status: AC
  Filled 2016-05-03: qty 2

## 2016-05-03 MED ORDER — HYDROMORPHONE HCL 1 MG/ML IJ SOLN
0.2500 mg | INTRAMUSCULAR | Status: DC | PRN
  Administered 2016-05-03 (×3): 0.5 mg via INTRAVENOUS

## 2016-05-03 MED ORDER — TRAMADOL HCL 50 MG PO TABS: 50.0000 mg | ORAL_TABLET | Freq: Four times a day (QID) | ORAL | 0 refills | Status: DC | PRN

## 2016-05-03 MED ORDER — MEPERIDINE HCL 25 MG/ML IJ SOLN: 6.2500 mg | INTRAMUSCULAR | Status: DC | PRN

## 2016-05-03 MED ORDER — PROPOFOL 10 MG/ML IV BOLUS
INTRAVENOUS | Status: AC
  Filled 2016-05-03: qty 40

## 2016-05-03 MED ORDER — MIDAZOLAM HCL 2 MG/2ML IJ SOLN
INTRAMUSCULAR | Status: AC
  Filled 2016-05-03: qty 2

## 2016-05-03 MED ORDER — PROPOFOL 10 MG/ML IV BOLUS
INTRAVENOUS | Status: DC | PRN
  Administered 2016-05-03: 20 mg via INTRAVENOUS
  Administered 2016-05-03: 40 mg via INTRAVENOUS
  Administered 2016-05-03: 20 mg via INTRAVENOUS
  Administered 2016-05-03: 50 mg via INTRAVENOUS

## 2016-05-03 MED ORDER — SCOPOLAMINE 1 MG/3DAYS TD PT72
1.0000 | MEDICATED_PATCH | Freq: Once | TRANSDERMAL | Status: DC
  Administered 2016-05-03: 1.5 mg via TRANSDERMAL

## 2016-05-03 MED ORDER — CEFAZOLIN SODIUM-DEXTROSE 2-4 GM/100ML-% IV SOLN: 2.0000 g | INTRAVENOUS | Status: DC

## 2016-05-03 MED ORDER — LIDOCAINE HCL (CARDIAC) 20 MG/ML IV SOLN
INTRAVENOUS | Status: DC | PRN
  Administered 2016-05-03 (×2): 30 mg via INTRAVENOUS
  Administered 2016-05-03: 20 mg via INTRAVENOUS

## 2016-05-03 MED ORDER — ONDANSETRON HCL 4 MG/2ML IJ SOLN: 4.0000 mg | Freq: Once | INTRAMUSCULAR | Status: DC | PRN

## 2016-05-03 MED ORDER — OXYCODONE HCL 5 MG PO TABS
5.0000 mg | ORAL_TABLET | Freq: Once | ORAL | Status: AC
  Administered 2016-05-03: 5 mg via ORAL

## 2016-05-03 MED ORDER — LACTATED RINGERS IV SOLN
INTRAVENOUS | Status: DC
  Administered 2016-05-03 (×2): via INTRAVENOUS

## 2016-05-03 MED ORDER — CHLOROPROCAINE HCL 1 % IJ SOLN
INTRAMUSCULAR | Status: AC
  Filled 2016-05-03: qty 30

## 2016-05-03 MED ORDER — KETOROLAC TROMETHAMINE 30 MG/ML IJ SOLN
INTRAMUSCULAR | Status: AC
  Filled 2016-05-03: qty 1

## 2016-05-03 MED ORDER — MIDAZOLAM HCL 2 MG/2ML IJ SOLN
INTRAMUSCULAR | Status: DC | PRN
  Administered 2016-05-03 (×2): 1 mg via INTRAVENOUS

## 2016-05-03 MED ORDER — LIDOCAINE HCL (CARDIAC) 20 MG/ML IV SOLN
INTRAVENOUS | Status: AC
  Filled 2016-05-03: qty 5

## 2016-05-03 MED ORDER — HYDROMORPHONE HCL 1 MG/ML IJ SOLN
INTRAMUSCULAR | Status: AC
  Filled 2016-05-03: qty 1

## 2016-05-03 MED ORDER — DEXAMETHASONE SODIUM PHOSPHATE 4 MG/ML IJ SOLN
INTRAMUSCULAR | Status: AC
  Filled 2016-05-03: qty 1

## 2016-05-03 MED ORDER — BUPIVACAINE HCL (PF) 0.25 % IJ SOLN
INTRAMUSCULAR | Status: AC
  Filled 2016-05-03: qty 30

## 2016-05-03 MED ORDER — HYDROMORPHONE HCL 1 MG/ML IJ SOLN
INTRAMUSCULAR | Status: AC
  Administered 2016-05-03: 0.5 mg via INTRAVENOUS
  Filled 2016-05-03: qty 1

## 2016-05-03 MED ORDER — OXYCODONE HCL 5 MG PO TABS
ORAL_TABLET | ORAL | Status: AC
  Filled 2016-05-03: qty 1

## 2016-05-03 MED ORDER — KETOROLAC TROMETHAMINE 30 MG/ML IJ SOLN
INTRAMUSCULAR | Status: DC | PRN
Start: 2016-05-03 — End: 2016-05-03
  Administered 2016-05-03: 30 mg via INTRAVENOUS

## 2016-05-03 MED ORDER — ONDANSETRON HCL 4 MG/2ML IJ SOLN
INTRAMUSCULAR | Status: AC
  Filled 2016-05-03: qty 2

## 2016-05-03 MED ORDER — CHLOROPROCAINE HCL 1 % IJ SOLN
INTRAMUSCULAR | Status: DC | PRN
  Administered 2016-05-03: 10 mL

## 2016-05-03 MED ORDER — SCOPOLAMINE 1 MG/3DAYS TD PT72
MEDICATED_PATCH | TRANSDERMAL | Status: AC
  Administered 2016-05-03: 1.5 mg via TRANSDERMAL
  Filled 2016-05-03: qty 1

## 2016-05-03 MED ORDER — FENTANYL CITRATE (PF) 100 MCG/2ML IJ SOLN
INTRAMUSCULAR | Status: DC | PRN
  Administered 2016-05-03 (×2): 50 ug via INTRAVENOUS

## 2016-05-03 SURGICAL SUPPLY — 18 items
CATH ROBINSON RED A/P 16FR (CATHETERS) ×3 IMPLANT
CLOTH BEACON ORANGE TIMEOUT ST (SAFETY) ×3 IMPLANT
DECANTER SPIKE VIAL GLASS SM (MISCELLANEOUS) ×3 IMPLANT
GLOVE BIO SURGEON STRL SZ7.5 (GLOVE) ×3 IMPLANT
GLOVE BIOGEL PI IND STRL 7.0 (GLOVE) ×1 IMPLANT
GLOVE BIOGEL PI INDICATOR 7.0 (GLOVE) ×2
GOWN STRL REUS W/TWL LRG LVL3 (GOWN DISPOSABLE) ×6 IMPLANT
KIT BERKELEY 1ST TRIMESTER 3/8 (MISCELLANEOUS) ×3 IMPLANT
NS IRRIG 1000ML POUR BTL (IV SOLUTION) ×3 IMPLANT
PACK VAGINAL MINOR WOMEN LF (CUSTOM PROCEDURE TRAY) ×3 IMPLANT
PAD OB MATERNITY 4.3X12.25 (PERSONAL CARE ITEMS) ×3 IMPLANT
PAD PREP 24X48 CUFFED NSTRL (MISCELLANEOUS) ×3 IMPLANT
SET BERKELEY SUCTION TUBING (SUCTIONS) ×3 IMPLANT
TOWEL OR 17X24 6PK STRL BLUE (TOWEL DISPOSABLE) ×6 IMPLANT
VACURETTE 10 RIGID CVD (CANNULA) IMPLANT
VACURETTE 7MM CVD STRL WRAP (CANNULA) ×3 IMPLANT
VACURETTE 8 RIGID CVD (CANNULA) IMPLANT
VACURETTE 9 RIGID CVD (CANNULA) IMPLANT

## 2016-05-03 NOTE — Anesthesia Postprocedure Evaluation (Signed)
Anesthesia Post Note  Patient: Misty Bauer  Procedure(s) Performed: Procedure(s) (LRB): DILATATION AND EVACUATION WITH  ULTRASOUND GUIDANCE  (N/A)  Patient location during evaluation: PACU Anesthesia Type: MAC Level of consciousness: awake and alert Pain management: pain level controlled Vital Signs Assessment: post-procedure vital signs reviewed and stable Respiratory status: spontaneous breathing, nonlabored ventilation, respiratory function stable and patient connected to nasal cannula oxygen Cardiovascular status: stable and blood pressure returned to baseline Anesthetic complications: no        Last Vitals:  Vitals:   05/03/16 1130 05/03/16 1140  BP: 108/72   Pulse: 79 (!) 59  Resp: 18 16  Temp:      Last Pain:  Vitals:   05/03/16 1140  TempSrc:   PainSc: 5    Pain Goal: Patients Stated Pain Goal: 3 (05/03/16 0927)               Hend Mccarrell DAVID

## 2016-05-03 NOTE — Addendum Note (Signed)
Addendum  created 05/03/16 1216 by Janeece Agee, CRNA   Anesthesia Intra Meds edited

## 2016-05-03 NOTE — Progress Notes (Signed)
Patient ID: Misty Bauer, female   DOB: Jun 25, 1985, 31 y.o.   MRN: [id number] Patient seen and examined. Consent witnessed and signed. No changes noted. Update completed.

## 2016-05-03 NOTE — Op Note (Signed)
NAME:  Misty Bauer, Misty Bauer NO.:  [id number]  MEDICAL RECORD NO.:  [id number]  LOCATION:  [facility]                         FACILITY:  [facility]  PHYSICIAN:  Lenoard Aden, M.D.DATE OF BIRTH:  May 13, 1985  DATE OF PROCEDURE: DATE OF DISCHARGE:                              OPERATIVE REPORT   PREOPERATIVE DIAGNOSIS:  Retained products of conception.  POSTOPERATIVE DIAGNOSIS:  Retained products of conception.  PROCEDURE:  Ultrasound-guided suction, dilatation, evacuation.  SURGEON:  Lenoard Aden, M.D.  ASSISTANT:  None.  ANESTHESIA:  Local and IV sedation.  ESTIMATED BLOOD LOSS:  Minimal.  DRAINS:  None.  COUNTS:  Correct.  DISPOSITION:  Patient to recovery in good condition.  BRIEF OPERATIVE NOTE:  After being apprised of risks of anesthesia, infection, bleeding, injury to surrounding organs, possible need for repair, delayed versus immediate complications to include bowel and bladder injury, possible need for repair, the patient was brought to the operating room where she was administered IV sedation and monitored anesthesia care without difficulty.  Feet were placed in Yellofin stirrups.  Exam under anesthesia revealed an anteflexed uterus and no adnexal masses, bleeding noted.  Ultrasound at the bedside confirmed some vascular tissue in the fundal area of the cervix.  Dilute Nesacaine solution was then placed paracervical block 20 mL total in standard fashion.  Cervix easily dilated to 21 Pratt dilator.  A 7 mm curved suction curette placed and visualization revealed vascular tissue at the fundus, which was aspirated without difficulty.  Blunt curettage in a 4-quadrant method confirmed the cavity to be empty.  Good hemostasis was noted.  Pictures were taken.  The patient tolerated the procedure well, was awakened and transferred to recovery in good condition.     Lenoard Aden, M.D.     RJT/MEDQ  D:  05/03/2016  T:  05/03/2016  Job:   [id number]  cc:   OB/GYN

## 2016-05-03 NOTE — Anesthesia Preprocedure Evaluation (Addendum)
Anesthesia Evaluation  Patient identified by MRN, date of birth, ID band Patient awake    Reviewed: Allergy & Precautions, NPO status , Patient's Chart, lab work & pertinent test results  Airway Mallampati: I  TM Distance: >3 FB Neck ROM: Full    Dental   Pulmonary Current Smoker,    Pulmonary exam normal        Cardiovascular Normal cardiovascular exam     Neuro/Psych    GI/Hepatic GERD  Medicated and Controlled,  Endo/Other    Renal/GU      Musculoskeletal   Abdominal   Peds  Hematology   Anesthesia Other Findings   Reproductive/Obstetrics                             Anesthesia Physical Anesthesia Plan  ASA: II  Anesthesia Plan: MAC   Post-op Pain Management:    Induction: Intravenous  Airway Management Planned: Simple Face Mask  Additional Equipment:   Intra-op Plan:   Post-operative Plan:   Informed Consent: I have reviewed the patients History and Physical, chart, labs and discussed the procedure including the risks, benefits and alternatives for the proposed anesthesia with the patient or authorized representative who has indicated his/her understanding and acceptance.     Plan Discussed with: CRNA and Surgeon  Anesthesia Plan Comments:        Anesthesia Quick Evaluation

## 2016-05-03 NOTE — Transfer of Care (Signed)
Immediate Anesthesia Transfer of Care Note  Patient: Misty Bauer  Procedure(s) Performed: Procedure(s) with comments: DILATATION AND EVACUATION WITH  ULTRASOUND GUIDANCE  (N/A) - NEEDS ULTRASOUND   Patient Location: PACU  Anesthesia Type:MAC  Level of Consciousness: awake, alert  and oriented  Airway & Oxygen Therapy: Patient Spontanous Breathing  Post-op Assessment: Report given to RN and Post -op Vital signs reviewed and stable  Post vital signs: Reviewed and stable  Last Vitals:  Vitals:   05/03/16 0927  BP: 119/89  Pulse: 85  Resp: 16  Temp: 36.7 C    Last Pain:  Vitals:   05/03/16 0927  TempSrc: Oral  PainSc: 4       Patients Stated Pain Goal: 3 (42/35/36 1443)  Complications: No apparent anesthesia complications

## 2016-05-03 NOTE — Discharge Instructions (Signed)

## 2016-05-03 NOTE — Op Note (Signed)
05/03/2016  11:11 AM  PATIENT:  Misty Bauer  31 y.o. female  PRE-OPERATIVE DIAGNOSIS:  Retained Products of Conception After Miscarriage  POST-OPERATIVE DIAGNOSIS:  RETAINED PRODUCTS POST MISCARRIAGE  PROCEDURE:  Procedure(s): DILATATION AND EVACUATION WITH  ULTRASOUND GUIDANCE   SURGEON:  Surgeon(s): Olivia Mackie, MD  ASSISTANTS: none   ANESTHESIA:   local and IV sedation  ESTIMATED BLOOD LOSS: minimal   DRAINS: none   LOCAL MEDICATIONS USED:  XYLOCAINE  and Amount: 20 ml  SPECIMEN:  Source of Specimen:  POC  DISPOSITION OF SPECIMEN:  PATHOLOGY  COUNTS:  YES  DICTATION #: [id number]  PLAN OF CARE: dc home  PATIENT DISPOSITION:  PACU - hemodynamically stable.

## 2016-05-04 ENCOUNTER — Encounter (HOSPITAL_COMMUNITY): Payer: Self-pay | Admitting: Obstetrics and Gynecology

## 2016-07-20 ENCOUNTER — Other Ambulatory Visit: Payer: Self-pay | Admitting: Neurology

## 2016-07-20 DIAGNOSIS — G43109 Migraine with aura, not intractable, without status migrainosus: Secondary | ICD-10-CM

## 2016-07-31 ENCOUNTER — Encounter: Payer: Self-pay | Admitting: Urgent Care

## 2016-07-31 ENCOUNTER — Ambulatory Visit (INDEPENDENT_AMBULATORY_CARE_PROVIDER_SITE_OTHER): Payer: 59 | Admitting: Urgent Care

## 2016-07-31 VITALS — BP 127/80 | HR 85 | Temp 98.5°F | Resp 16 | Ht 62.0 in | Wt 99.0 lb

## 2016-07-31 DIAGNOSIS — R300 Dysuria: Secondary | ICD-10-CM

## 2016-07-31 DIAGNOSIS — R350 Frequency of micturition: Secondary | ICD-10-CM

## 2016-07-31 DIAGNOSIS — Z87440 Personal history of urinary (tract) infections: Secondary | ICD-10-CM | POA: Diagnosis not present

## 2016-07-31 DIAGNOSIS — Z87442 Personal history of urinary calculi: Secondary | ICD-10-CM | POA: Diagnosis not present

## 2016-07-31 DIAGNOSIS — N3090 Cystitis, unspecified without hematuria: Secondary | ICD-10-CM

## 2016-07-31 DIAGNOSIS — R102 Pelvic and perineal pain: Secondary | ICD-10-CM

## 2016-07-31 LAB — BASIC METABOLIC PANEL
BUN/Creatinine Ratio: 19 (ref 9–23)
BUN: 15 mg/dL (ref 6–20)
CALCIUM: 9.5 mg/dL (ref 8.7–10.2)
CO2: 22 mmol/L (ref 20–29)
Chloride: 102 mmol/L (ref 96–106)
Creatinine, Ser: 0.77 mg/dL (ref 0.57–1.00)
GFR calc Af Amer: 119 mL/min/{1.73_m2} (ref >59)
GFR calc non Af Amer: 103 mL/min/{1.73_m2} (ref >59)
GLUCOSE: 87 mg/dL (ref 65–99)
POTASSIUM: 4.1 mmol/L (ref 3.5–5.2)
SODIUM: 139 mmol/L (ref 134–144)

## 2016-07-31 LAB — POC MICROSCOPIC URINALYSIS (UMFC)

## 2016-07-31 LAB — POCT CBC
Granulocyte percent: 81.7 %G — AB (ref 37–80)
HCT, POC: 39 % (ref 37.7–47.9)
HEMOGLOBIN: 13.3 g/dL (ref 12.2–16.2)
LYMPH, POC: 1.5 (ref 0.6–3.4)
MCH, POC: 30.8 pg (ref 27–31.2)
MCHC: 34.2 g/dL (ref 31.8–35.4)
MCV: 90.1 fL (ref 80–97)
MID (CBC): 0.5 (ref 0–0.9)
MPV: 7.7 fL (ref 0–99.8)
PLATELET COUNT, POC: 254 10*3/uL (ref 142–424)
POC Granulocyte: 9.1 — AB (ref 2–6.9)
POC LYMPH PERCENT: 13.4 %L (ref 10–50)
POC MID %: 4.9 %M (ref 0–12)
RBC: 4.33 M/uL (ref 4.04–5.48)
RDW, POC: 14.3 %
WBC: 11.1 10*3/uL — AB (ref 4.6–10.2)

## 2016-07-31 LAB — POCT URINALYSIS DIP (MANUAL ENTRY)
BILIRUBIN UA: NEGATIVE
GLUCOSE UA: NEGATIVE mg/dL
Ketones, POC UA: NEGATIVE mg/dL
LEUKOCYTES UA: NEGATIVE
Nitrite, UA: NEGATIVE
Spec Grav, UA: 1.02 (ref 1.010–1.025)
UROBILINOGEN UA: 0.2 U/dL
pH, UA: 6 (ref 5.0–8.0)

## 2016-07-31 LAB — POCT URINE PREGNANCY: Preg Test, Ur: NEGATIVE

## 2016-07-31 MED ORDER — CIPROFLOXACIN HCL 500 MG PO TABS: 500.0000 mg | ORAL_TABLET | Freq: Two times a day (BID) | ORAL | 0 refills | Status: DC

## 2016-07-31 MED ORDER — CELECOXIB 200 MG PO CAPS: 200.0000 mg | ORAL_CAPSULE | Freq: Two times a day (BID) | ORAL | 1 refills | Status: DC

## 2016-07-31 NOTE — Patient Instructions (Addendum)
Urinary Tract Infection, Adult A urinary tract infection (UTI) is an infection of any part of the urinary tract, which includes the kidneys, ureters, bladder, and urethra. These organs make, store, and get rid of urine in the body. UTI can be a bladder infection (cystitis) or kidney infection (pyelonephritis). What are the causes? This infection may be caused by fungi, viruses, or bacteria. Bacteria are the most common cause of UTIs. This condition can also be caused by repeated incomplete emptying of the bladder during urination. What increases the risk? This condition is more likely to develop if:  You ignore your need to urinate or hold urine for long periods of time.  You do not empty your bladder completely during urination.  You wipe back to front after urinating or having a bowel movement, if you are female.  You are uncircumcised, if you are female.  You are constipated.  You have a urinary catheter that stays in place (indwelling).  You have a weak defense (immune) system.  You have a medical condition that affects your bowels, kidneys, or bladder.  You have diabetes.  You take antibiotic medicines frequently or for long periods of time, and the antibiotics no longer work well against certain types of infections (antibiotic resistance).  You take medicines that irritate your urinary tract.  You are exposed to chemicals that irritate your urinary tract.  You are female. What are the signs or symptoms? Symptoms of this condition include:  Fever.  Frequent urination or passing small amounts of urine frequently.  Needing to urinate urgently.  Pain or burning with urination.  Urine that smells bad or unusual.  Cloudy urine.  Pain in the lower abdomen or back.  Trouble urinating.  Blood in the urine.  Vomiting or being less hungry than normal.  Diarrhea or abdominal pain.  Vaginal discharge, if you are female. How is this diagnosed? This condition is  diagnosed with a medical history and physical exam. You will also need to provide a urine sample to test your urine. Other tests may be done, including:  Blood tests.  Sexually transmitted disease (STD) testing. If you have had more than one UTI, a cystoscopy or imaging studies may be done to determine the cause of the infections. How is this treated? Treatment for this condition often includes a combination of two or more of the following:  Antibiotic medicine.  Other medicines to treat less common causes of UTI.  Over-the-counter medicines to treat pain.  Drinking enough water to stay hydrated. Follow these instructions at home:  Take over-the-counter and prescription medicines only as told by your health care provider.  If you were prescribed an antibiotic, take it as told by your health care provider. Do not stop taking the antibiotic even if you start to feel better.  Avoid alcohol, caffeine, tea, and carbonated beverages. They can irritate your bladder.  Drink enough fluid to keep your urine clear or pale yellow.  Keep all follow-up visits as told by your health care provider. This is important.  Make sure to:  Empty your bladder often and completely. Do not hold urine for long periods of time.  Empty your bladder before and after sex.  Wipe from front to back after a bowel movement if you are female. Use each tissue one time when you wipe. Contact a health care provider if:  You have back pain.  You have a fever.  You feel nauseous or vomit.  Your symptoms do not get better after 3   days.  Your symptoms go away and then return. Get help right away if:  You have severe back pain or lower abdominal pain.  You are vomiting and cannot keep down any medicines or water. This information is not intended to replace advice given to you by your health care provider. Make sure you discuss any questions you have with your health care provider. Document Released:  10/05/2004 Document Revised: 06/09/2015 Document Reviewed: 11/16/2014 Elsevier Interactive Patient Education  2017 [employer]     IF you received an x-ray today, you will receive an invoice from [facility]. Please contact [facility] at [phone] with questions or concerns regarding your invoice.   IF you received labwork today, you will receive an invoice from [facility]. Please contact [facility] at [phone] with questions or concerns regarding your invoice.   Our billing staff will not be able to assist you with questions regarding bills from these companies.  You will be contacted with the lab results as soon as they are available. The fastest way to get your results is to activate your My Chart account. Instructions are located on the last page of this paperwork. If you have not heard from us regarding the results in 2 weeks, please contact this office.      

## 2016-07-31 NOTE — Progress Notes (Signed)
MRN: [id number] DOB: 06-Sep-1985  Subjective:   Misty Bauer is a 31 y.o. female presenting for chief complaint of Bladder Pain (Started Saturday); Urinary Frequency; Dysuria; and Urinary Incontinence  Reports 3 day history of dysuria, urinary frequency, urinary urgency, flank pain and pelvic pain. Has tried ibuprofen with some relief of flank pain. Denies fever, hematuria, abdominal pain, cloudy malordorous urine, genital irritation and vaginal discharge, nausea and vomiting. Patient is hydrating very well. Her LMP was 07/08/2016, was regular but a little late. Admits that she has heavy and painful cycles. She does not use contraception, her husband and patient are trying to become pregnant. She did have a miscarriage in 02/2016. Has had renal stones in the past, as well as UTIs. Last episode of either has been 10 years ago. Smokes 1/2 ppd.   Misty Bauer has a current medication list which includes the following prescription(s): cetirizine, eletriptan, fluticasone, nortriptyline, and prenatal vit-fe fumarate-fa. Patient is allergic to amoxicillin; coconut flavor; and penicillins.  Misty Bauer  has a past medical history of Allergy; Anxiety; Clotting disorder (HCC); Depression; GERD (gastroesophageal reflux disease); History of kidney stones; ITP (idiopathic thrombocytopenic purpura); and Migraine. Also  has a past surgical history that includes Wisdom tooth extraction (2014) and Dilation and evacuation (N/A, 05/03/2016).  Objective:   Vitals: BP 127/80   Pulse 85   Temp 98.5 F (36.9 C) (Oral)   Resp 16   Ht 5\' 2"  (1.575 m)   Wt 99 lb (44.9 kg)   LMP 07/08/2016   SpO2 99%   BMI 18.11 kg/m   Physical Exam  Constitutional: She is oriented to person, place, and time. She appears well-developed and well-nourished.  HENT:  Mouth/Throat: Oropharynx is clear and moist.  Eyes: No scleral icterus.  Cardiovascular: Normal rate, regular rhythm and intact distal pulses.  Exam reveals no gallop and  no friction rub.   No murmur heard. Pulmonary/Chest: No respiratory distress. She has no wheezes. She has no rales.  Abdominal: Soft. Bowel sounds are normal. She exhibits no distension and no mass. There is tenderness (lower, pelvic, L>R). There is no guarding.  No CVA tenderness.  Genitourinary: No labial fusion. There is no rash, tenderness, lesion or injury on the right labia. There is no rash, tenderness, lesion or injury on the left labia. Uterus is not deviated, not enlarged, not fixed and not tender. Cervix exhibits motion tenderness. Cervix exhibits no discharge and no friability. Right adnexum displays no mass, no tenderness and no fullness. Left adnexum displays no mass, no tenderness and no fullness. No erythema, tenderness or bleeding in the vagina. No foreign body in the vagina. No signs of injury around the vagina. No vaginal discharge found.  Lymphadenopathy:       Right: No inguinal adenopathy present.       Left: No inguinal adenopathy present.  Neurological: She is alert and oriented to person, place, and time.  Skin: Skin is warm and dry.   Results for orders placed or performed in visit on 07/31/16 (from the past 24 hour(s))  POCT urinalysis dipstick     Status: Abnormal   Collection Time: 07/31/16 10:22 AM  Result Value Ref Range   Color, UA yellow yellow   Clarity, UA cloudy (A) clear   Glucose, UA negative negative mg/dL   Bilirubin, UA negative negative   Ketones, POC UA negative negative mg/dL   Spec Grav, UA 4.098 1.191 - 1.025   Blood, UA large (A) negative   pH, UA 6.0 5.0 -  8.0   Protein Ur, POC trace (A) negative mg/dL   Urobilinogen, UA 0.2 0.2 or 1.0 E.U./dL   Nitrite, UA Negative Negative   Leukocytes, UA Negative Negative  POCT Microscopic Urinalysis ([facility])     Status: Abnormal   Collection Time: 07/31/16 10:22 AM  Result Value Ref Range   WBC,UR,HPF,POC Few (A) None WBC/hpf   RBC,UR,HPF,POC Too numerous to count  (A) None RBC/hpf   Bacteria  Moderate (A) None, Too numerous to count   Mucus Present (A) Absent   Epithelial Cells, UR Per Microscopy Moderate (A) None, Too numerous to count cells/hpf  POCT urine pregnancy     Status: None   Collection Time: 07/31/16 10:50 AM  Result Value Ref Range   Preg Test, Ur Negative Negative  POCT CBC     Status: Abnormal   Collection Time: 07/31/16 11:00 AM  Result Value Ref Range   WBC 11.1 (A) 4.6 - 10.2 K/uL   Lymph, poc 1.5 0.6 - 3.4   POC LYMPH PERCENT 13.4 10 - 50 %L   MID (cbc) 0.5 0 - 0.9   POC MID % 4.9 0 - 12 %M   POC Granulocyte 9.1 (A) 2 - 6.9   Granulocyte percent 81.7 (A) 37 - 80 %G   RBC 4.33 4.04 - 5.48 M/uL   Hemoglobin 13.3 12.2 - 16.2 g/dL   HCT, POC 62.939.0 52.837.7 - 47.9 %   MCV 90.1 80 - 97 fL   MCH, POC 30.8 27 - 31.2 pg   MCHC 34.2 31.8 - 35.4 g/dL   RDW, POC 41.314.3 %   Platelet Count, POC 254 142 - 424 K/uL   MPV 7.7 0 - 99.8 fL   Assessment and Plan :   1. Cystitis 2. Dysuria 3. Urinary frequency 4. Pelvic pain in female 5. History of renal stone 6. History of recurrent UTIs - Start ciprofloxacin to cover for infectious process. Patient prefers not to do pelvic/transvaginal U/S. She would like to hold off on renal stone CT. Labs are pending. Counseled patient on potential for adverse effects with medications prescribed today, patient verbalized understanding. Return-to-clinic precautions discussed, patient verbalized understanding.   Wallis BambergMario Antwon Rochin, PA-C [facility] [phone] 07/31/2016 10:17 AM

## 2016-08-01 LAB — URINE CULTURE: Organism ID, Bacteria: NO GROWTH

## 2016-08-16 IMAGING — CR DG KNEE COMPLETE 4+V*L*
4 series · 4 of 4 positions shown · non-contrast
Comparison: 10/12/2008

CLINICAL DATA: Left knee popped 2 days ago with persistent pain,
initial encounter

EXAM:
LEFT KNEE - COMPLETE 4+ VIEW

[AP]
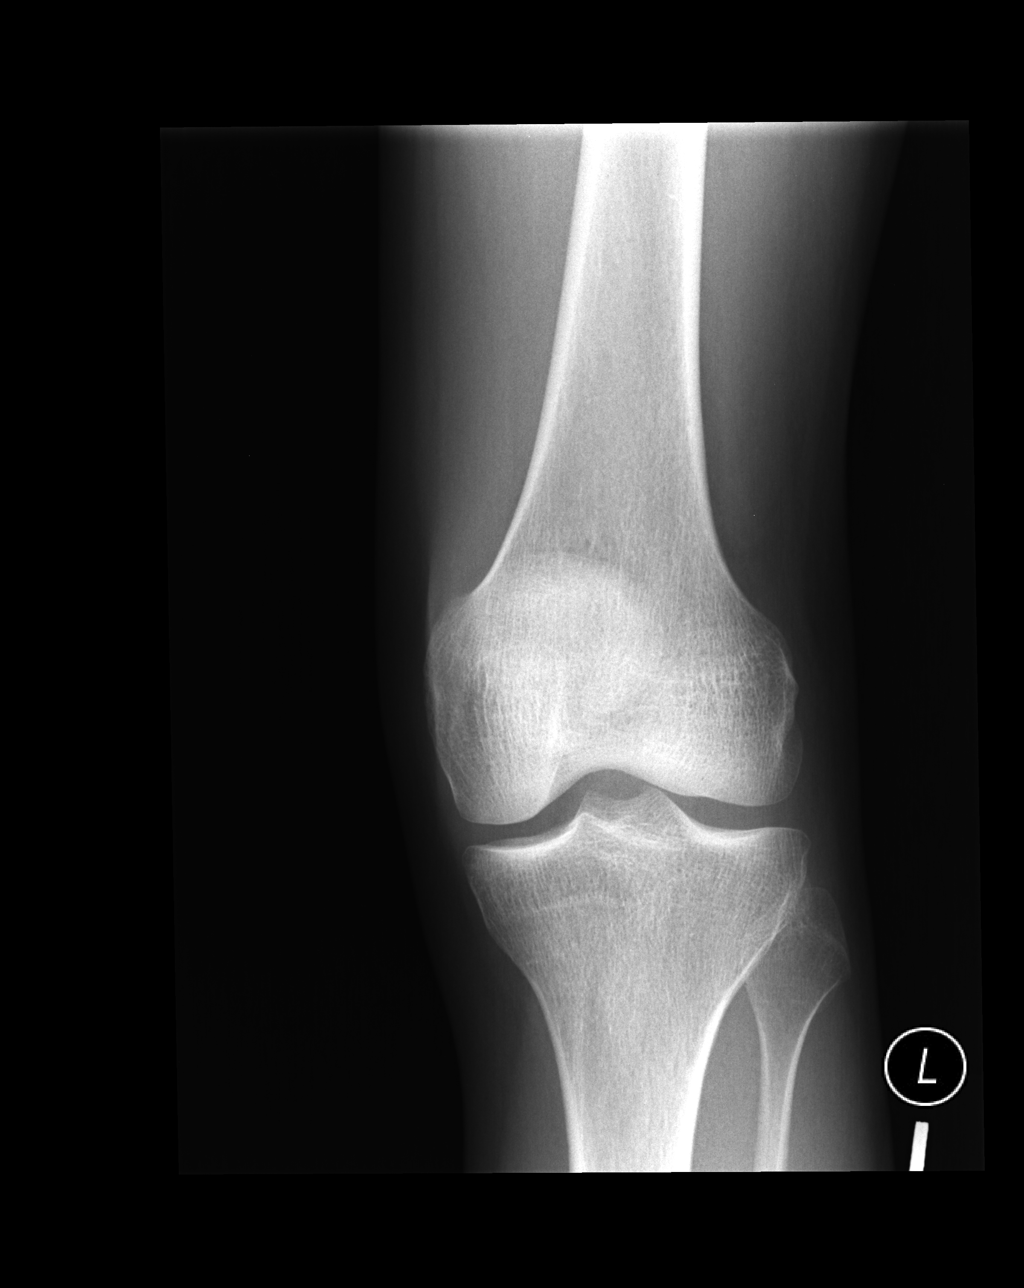

[lateral]
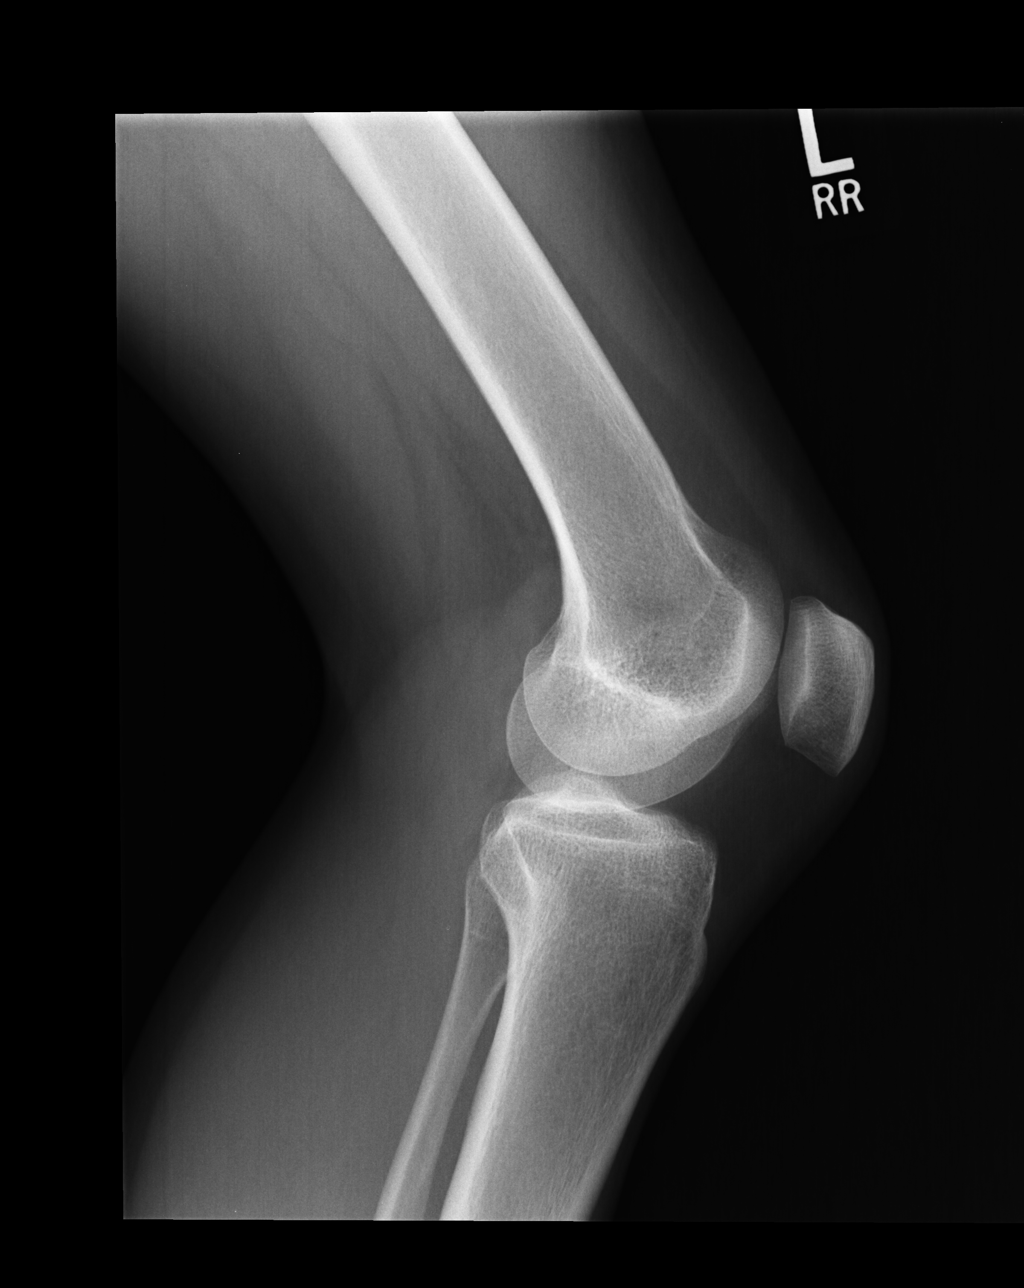

[ap axial]
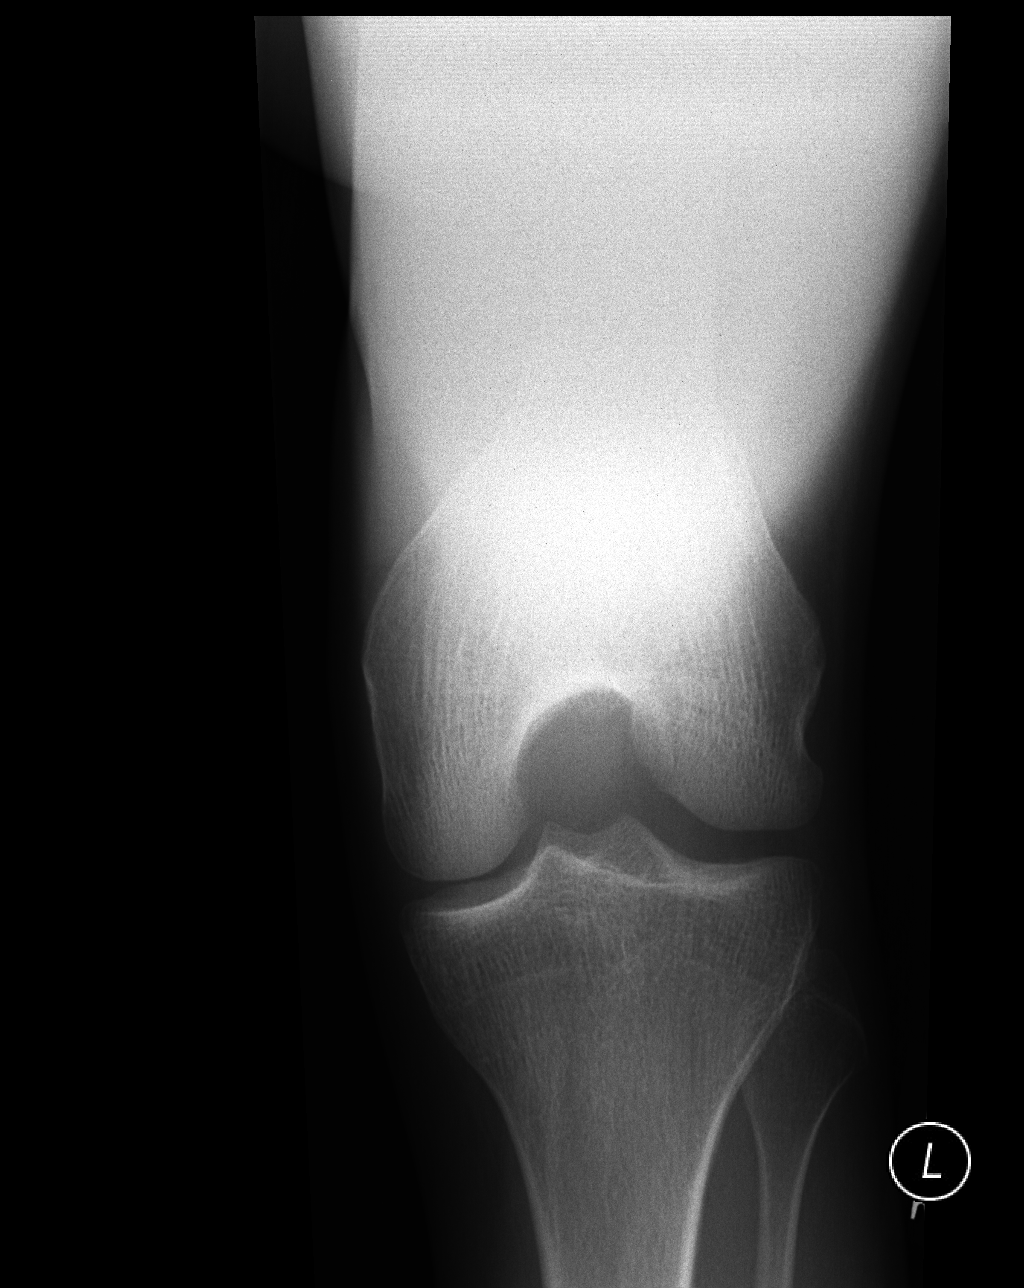

[sunrise]
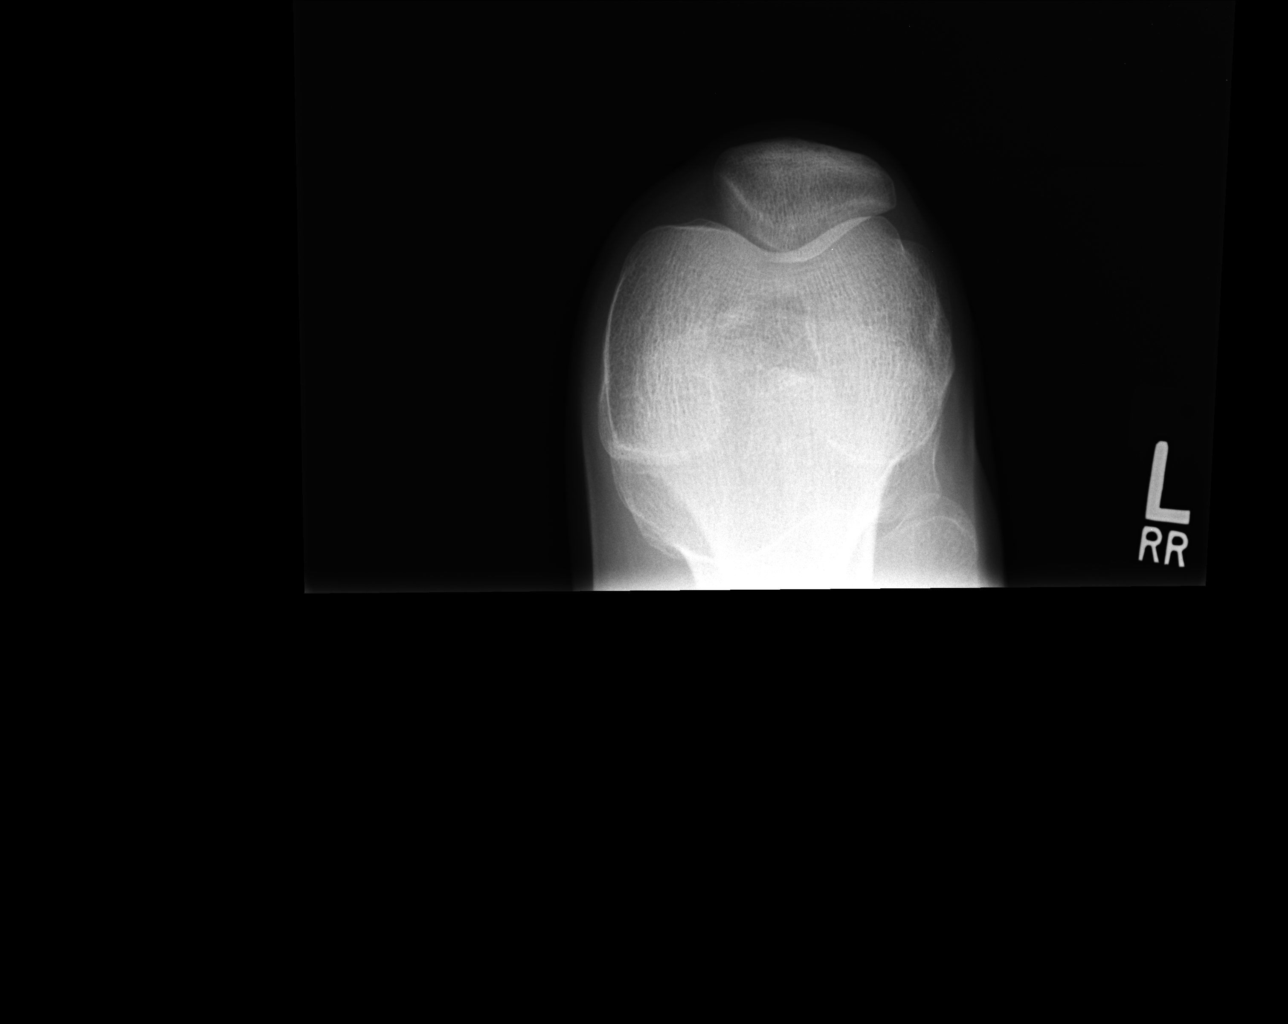

[4 of 4 positions shown; findings below may reference images not displayed]

FINDINGS: There is no evidence of fracture, dislocation, or joint effusion.
There is no evidence of arthropathy or other focal bone abnormality.
Soft tissues are unremarkable.
IMPRESSION: No acute abnormality noted.

## 2016-11-06 ENCOUNTER — Ambulatory Visit (INDEPENDENT_AMBULATORY_CARE_PROVIDER_SITE_OTHER): Payer: 59 | Admitting: Physician Assistant

## 2016-11-06 ENCOUNTER — Encounter: Payer: Self-pay | Admitting: Physician Assistant

## 2016-11-06 VITALS — BP 118/74 | HR 98 | Temp 98.6°F | Resp 18 | Ht 63.78 in | Wt 100.6 lb

## 2016-11-06 DIAGNOSIS — R110 Nausea: Secondary | ICD-10-CM

## 2016-11-06 DIAGNOSIS — R42 Dizziness and giddiness: Secondary | ICD-10-CM | POA: Diagnosis not present

## 2016-11-06 DIAGNOSIS — H8112 Benign paroxysmal vertigo, left ear: Secondary | ICD-10-CM | POA: Diagnosis not present

## 2016-11-06 MED ORDER — MECLIZINE HCL 32 MG PO TABS: 32.0000 mg | ORAL_TABLET | Freq: Three times a day (TID) | ORAL | 0 refills | Status: DC | PRN

## 2016-11-06 MED ORDER — ONDANSETRON 4 MG PO TBDP: 4.0000 mg | ORAL_TABLET | Freq: Three times a day (TID) | ORAL | 0 refills | Status: DC | PRN

## 2016-11-06 NOTE — Progress Notes (Deleted)
Subjective:     Misty Bauer is a 31 y.o. female who presents for evaluation of dizziness. The symptoms started {1-10:13787} {time;units:19136} ago and are {clinical course:19573}. The attacks occur {frequency:19169} and last {time dizziness:13553} {time units:19177::"minutes"}. Positions that worsen symptoms: {dizziness positions:13554}. Previous workup/treatments: {dizziness eval/tx:13555::"none"}. Associated ear symptoms: {dizziness ear symptoms:13556::"none"}. Associated CNS symptoms: {dizziness cns symptoms:13559::"none"}. Recent infections: {dizziness infections:13560}. Head trauma: {dizziness head trauma:13561}. Drug ingestion: {dizziness ingestion:13562}. Noise exposure: {dizziness noise:13563}. Family history: {dizziness fam hx:13565}.  {Common ambulatory SmartLinks:19316}  Review of Systems {ros; complete:30496}    Objective:    {exam; complete:17964}      Assessment:    {dizziness assessment:60519}    Plan:    {Vertigo treatment plan:17820}

## 2016-11-06 NOTE — Progress Notes (Signed)
Misty Bauer  MRN: [id number] DOB: 11-10-1985  Subjective:   Misty Bauer is a 31 y.o. female who presents for evaluation of dizziness. The symptoms started 1 day ago and are improved. The attacks occur intermittently and last 30 seconds. Positions that worsen symptoms: rapid head movement. Has associated nausea. Previous workup/treatments: none. Has hx of vertigo, which lasted about 4 days and resolved..  Associated ear symptoms: aural pressure and tinnitus. Associated CNS symptoms: none. Recent infections: upper respiratory infection. Head trauma: denied. Drug ingestion: none. Noise exposure: no occupational exposure. Family history: non-contributory.  Review of Systems  Constitutional: Negative for chills, diaphoresis, fatigue and fever.  HENT: Positive for congestion.   Gastrointestinal: Negative for abdominal pain and vomiting.  Neurological: Negative for syncope, speech difficulty, weakness, numbness and headaches.  Psychiatric/Behavioral: Negative for confusion.    Patient Active Problem List   Diagnosis Date Noted  . Cutaneous wart 08/19/2015  . Migraine with aura and without status migrainosus, not intractable 10/09/2014  . Worsening headaches 10/09/2014    Current Outpatient Prescriptions on File Prior to Visit  Medication Sig Dispense Refill  . celecoxib (CELEBREX) 200 MG capsule Take 1 capsule (200 mg total) by mouth 2 (two) times daily. 30 capsule 1  . eletriptan (RELPAX) 20 MG tablet TAKE 1 TAB BY MOUTH AS NEEDED FOR MIGRAINE. MAY REPEAT IN 2 HRS IF PERSISTS. DO NOT TAKE OVER 3X/WK 10 tablet 2  . fluticasone (FLONASE) 50 MCG/ACT nasal spray Place into both nostrils daily. Reported on 02/03/2015    . nortriptyline (PAMELOR) 10 MG capsule Take 4 capsules at bedtime. 360 capsule 3  . Prenatal Vit-Fe Fumarate-FA (PRENATAL PLUS PO) Take 1 tablet by mouth daily.    . cetirizine (ZYRTEC) 10 MG tablet Take 10 mg by mouth daily as needed for allergies.     No current  facility-administered medications on file prior to visit.     Allergies  Allergen Reactions  . Amoxicillin Rash  . Coconut Flavor Anaphylaxis, Shortness Of Breath and Swelling  . Penicillins Hives    Has patient had a PCN reaction causing immediate rash, facial/tongue/throat swelling, SOB or lightheadedness with hypotension:yes Has patient had a PCN reaction causing severe rash involving mucus membranes or skin necrosis: no Has patient had a PCN reaction that required hospitalization no Has patient had a PCN reaction occurring within the last 10 years:yes If all of the above answers are "NO", then may proceed with Cephalosporin use.      Objective:  BP 118/74 (BP Location: Right Arm, Patient Position: Sitting, Cuff Size: Normal)   Pulse 98   Temp 98.6 F (37 C) (Oral)   Resp 18   Ht 5' 3.78" (1.62 m)   Wt 100 lb 9.6 oz (45.6 kg)   LMP 10/11/2016 (Approximate)   SpO2 99%   BMI 17.39 kg/m   Physical Exam  Constitutional: She is oriented to person, place, and time and well-developed, well-nourished, and in no distress.  HENT:  Head: Normocephalic and atraumatic.  Right Ear: Tympanic membrane is not erythematous and not bulging. A middle ear effusion is present.  Left Ear: Tympanic membrane is not erythematous and not bulging. A middle ear effusion is present.  Nose: Mucosal edema ( mild bilaterally) and rhinorrhea (more notable in right nostril) present. Right sinus exhibits no maxillary sinus tenderness and no frontal sinus tenderness. Left sinus exhibits no maxillary sinus tenderness and no frontal sinus tenderness.  Mouth/Throat: Uvula is midline, oropharynx is clear and moist and mucous membranes are  normal.  Eyes: Pupils are equal, round, and reactive to light. Conjunctivae and EOM are normal.  Neck: Normal range of motion.  Cardiovascular: Normal rate, regular rhythm and normal heart sounds.   Pulmonary/Chest: Effort normal and breath sounds normal. She has no wheezes. She  has no rhonchi. She has no rales.  Neurological: She is alert and oriented to person, place, and time. She has normal sensation and normal strength. She displays normal speech. She has a normal Finger-Nose-Finger Test, a normal Romberg Test and a normal Tandem Gait Test. Gait normal.  Reflex Scores:      Tricep reflexes are 2+ on the right side and 2+ on the left side.      Bicep reflexes are 2+ on the right side and 2+ on the left side.      Brachioradialis reflexes are 2+ on the right side and 2+ on the left side.      Patellar reflexes are 2+ on the right side and 2+ on the left side. Dix-Hallpike elicited dizziness to the left, no nystagmus noted.   Skin: Skin is warm and dry.  Psychiatric: Affect normal.  Vitals reviewed.  Epley maneuver performed with moderate success.  Patient notes improvement in dizziness  Assessment and Plan :  1. Dizziness Improved.  History and physical exam findings highly suspicious for BPPV. Symptoms improved maneuver.  Patient given educational material on BPPV and how to perform Epley maneuver at home.  Given strict ED/return precautions. - meclizine (ANTIVERT) 32 MG tablet; Take 1 tablet (32 mg total) by mouth 3 (three) times daily as needed.  Dispense: 30 tablet; Refill: 0 2. Nausea without vomiting - ondansetron (ZOFRAN ODT) 4 MG disintegrating tablet; Take 1 tablet (4 mg total) by mouth every 8 (eight) hours as needed for nausea or vomiting.  Dispense: 20 tablet; Refill: 0 3. Benign paroxysmal positional vertigo of left ear  Benjiman Core PA-C  [facility] at [facility] 11/06/2016 4:32 PM

## 2016-11-06 NOTE — Progress Notes (Deleted)
   Misty Bauer  MRN: [id number] DOB: 1985-03-02  Subjective:  Misty Bauer is a [age] y.o. female seen in office today for a chief complaint of ***  Review of Systems  Patient Active Problem List   Diagnosis Date Noted  . Cutaneous wart 08/19/2015  . Migraine with aura and without status migrainosus, not intractable 10/09/2014  . Worsening headaches 10/09/2014    Current Outpatient Prescriptions on File Prior to Visit  Medication Sig Dispense Refill  . celecoxib (CELEBREX) 200 MG capsule Take 1 capsule (200 mg total) by mouth 2 (two) times daily. 30 capsule 1  . cetirizine (ZYRTEC) 10 MG tablet Take 10 mg by mouth daily as needed for allergies.    . ciprofloxacin (CIPRO) 500 MG tablet Take 1 tablet (500 mg total) by mouth 2 (two) times daily. 20 tablet 0  . eletriptan (RELPAX) 20 MG tablet TAKE 1 TAB BY MOUTH AS NEEDED FOR MIGRAINE. MAY REPEAT IN 2 HRS IF PERSISTS. DO NOT TAKE OVER 3X/WK 10 tablet 2  . fluticasone (FLONASE) 50 MCG/ACT nasal spray Place into both nostrils daily. Reported on 02/03/2015    . nortriptyline (PAMELOR) 10 MG capsule Take 4 capsules at bedtime. 360 capsule 3  . Prenatal Vit-Fe Fumarate-FA (PRENATAL PLUS PO) Take 1 tablet by mouth daily.     No current facility-administered medications on file prior to visit.     Allergies  Allergen Reactions  . Amoxicillin Rash  . Coconut Flavor Anaphylaxis, Shortness Of Breath and Swelling  . Penicillins Hives    Has patient had a PCN reaction causing immediate rash, facial/tongue/throat swelling, SOB or lightheadedness with hypotension:yes Has patient had a PCN reaction causing severe rash involving mucus membranes or skin necrosis: no Has patient had a PCN reaction that required hospitalization no Has patient had a PCN reaction occurring within the last 10 years:yes If all of the above answers are "NO", then may proceed with Cephalosporin use.      Objective:  There were no vitals taken for this  visit.  Physical Exam  Assessment and Plan :  *** There are no diagnoses linked to this encounter.   Benjiman CoreBrittany Wiseman PA-C  [facility] 11/06/2016 3:06 PM

## 2016-11-06 NOTE — Patient Instructions (Addendum)
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  Below some information about this disorder.  I have also given you information about the Epley maneuver and how to perform at home.  In the meantime you may try Antivert and Zofran for dizziness and nausea.  If her symptoms persist past 4 days or anything worsens, please return to office for further evaluation.  If you develop any concerning symptoms like confusion, vomiting, double vision, complete hearing loss, or increasing headache please seek care immediately. Benign Positional Vertigo Vertigo is the feeling that you or your surroundings are moving when they are not. Benign positional vertigo is the most common form of vertigo. The cause of this condition is not serious (is benign). This condition is triggered by certain movements and positions (is positional). This condition can be dangerous if it occurs while you are doing something that could endanger you or others, such as driving. What are the causes? In many cases, the cause of this condition is not known. It may be caused by a disturbance in an area of the inner ear that helps your brain to sense movement and balance. This disturbance can be caused by a viral infection (labyrinthitis), head injury, or repetitive motion. What increases the risk? This condition is more likely to develop in:  Women.  People who are 74 years of age or older.  What are the signs or symptoms? Symptoms of this condition usually happen when you move your head or your eyes in different directions. Symptoms may start suddenly, and they usually last for less than a minute. Symptoms may include:  Loss of balance and falling.  Feeling like you are spinning or moving.  Feeling like your surroundings are spinning or moving.  Nausea and vomiting.  Blurred vision.  Dizziness.  Involuntary eye movement (nystagmus).  Symptoms can be mild and cause only slight annoyance, or they can be severe and  interfere with daily life. Episodes of benign positional vertigo may return (recur) over time, and they may be triggered by certain movements. Symptoms may improve over time. How is this diagnosed? This condition is usually diagnosed by medical history and a physical exam of the head, neck, and ears. You may be referred to a health care provider who specializes in ear, nose, and throat (ENT) problems (otolaryngologist) or a provider who specializes in disorders of the nervous system (neurologist). You may have additional testing, including:  MRI.  A CT scan.  Eye movement tests. Your health care provider may ask you to change positions quickly while he or she watches you for symptoms of benign positional vertigo, such as nystagmus. Eye movement may be tested with an electronystagmogram (ENG), caloric stimulation, the Dix-Hallpike test, or the roll test.  An electroencephalogram (EEG). This records electrical activity in your brain.  Hearing tests.  How is this treated? Usually, your health care provider will treat this by moving your head in specific positions to adjust your inner ear back to normal. Surgery may be needed in severe cases, but this is rare. In some cases, benign positional vertigo may resolve on its own in 2-4 weeks. Follow these instructions at home: Safety  Move slowly.Avoid sudden body or head movements.  Avoid driving.  Avoid operating heavy machinery.  Avoid doing any tasks that would be dangerous to you or others if a vertigo episode would occur.  If you have trouble walking or keeping your balance, try using a cane for stability. If you feel dizzy or unstable, sit  down right away.  Return to your normal activities as told by your health care provider. Ask your health care provider what activities are safe for you. General instructions  Take over-the-counter and prescription medicines only as told by your health care provider.  Avoid certain positions or  movements as told by your health care provider.  Drink enough fluid to keep your urine clear or pale yellow.  Keep all follow-up visits as told by your health care provider. This is important. Contact a health care provider if:  You have a fever.  Your condition gets worse or you develop new symptoms.  Your family or friends notice any behavioral changes.  Your nausea or vomiting gets worse.  You have numbness or a "pins and needles" sensation. Get help right away if:  You have difficulty speaking or moving.  You are always dizzy.  You faint.  You develop severe headaches.  You have weakness in your legs or arms.  You have changes in your hearing or vision.  You develop a stiff neck.  You develop sensitivity to light. This information is not intended to replace advice given to you by your health care provider. Make sure you discuss any questions you have with your health care provider. Document Released: 10/03/2005 Document Revised: 06/03/2015 Document Reviewed: 04/20/2014 Elsevier Interactive Patient Education  2018 [employer]  How to Perform the Epley Maneuver The Epley maneuver is an exercise that relieves symptoms of vertigo. Vertigo is the feeling that you or your surroundings are moving when they are not. When you feel vertigo, you may feel like the room is spinning and have trouble walking. Dizziness is a little different than vertigo. When you are dizzy, you may feel unsteady or light-headed. You can do this maneuver at home whenever you have symptoms of vertigo. You can do it up to 3 times a day until your symptoms go away. Even though the Epley maneuver may relieve your vertigo for a few weeks, it is possible that your symptoms will return. This maneuver relieves vertigo, but it does not relieve dizziness. What are the risks? If it is done correctly, the Epley maneuver is considered safe. Sometimes it can lead to dizziness or nausea that goes away after a short  time. If you develop other symptoms, such as changes in vision, weakness, or numbness, stop doing the maneuver and call your health care provider. How to perform the Epley maneuver 1. Sit on the edge of a bed or table with your back straight and your legs extended or hanging over the edge of the bed or table. 2. Turn your head halfway toward the affected ear or side. 3. Lie backward quickly with your head turned until you are lying flat on your back. You may want to position a pillow under your shoulders. 4. Hold this position for 30 seconds. You may experience an attack of vertigo. This is normal. 5. Turn your head to the opposite direction until your unaffected ear is facing the floor. 6. Hold this position for 30 seconds. You may experience an attack of vertigo. This is normal. Hold this position until the vertigo stops. 7. Turn your whole body to the same side as your head. Hold for another 30 seconds. 8. Sit back up. You can repeat this exercise up to 3 times a day. Follow these instructions at home:  After doing the Epley maneuver, you can return to your normal activities.  Ask your health care provider if there is anything you  should do at home to prevent vertigo. He or she may recommend that you: ? Keep your head raised (elevated) with two or more pillows while you sleep. ? Do not sleep on the side of your affected ear. ? Get up slowly from bed. ? Avoid sudden movements during the day. ? Avoid extreme head movement, like looking up or bending over. Contact a health care provider if:  Your vertigo gets worse.  You have other symptoms, including: ? Nausea. ? Vomiting. ? Headache. Get help right away if:  You have vision changes.  You have a severe or worsening headache or neck pain.  You cannot stop vomiting.  You have new numbness or weakness in any part of your body. Summary  Vertigo is the feeling that you or your surroundings are moving when they are not.  The  Epley maneuver is an exercise that relieves symptoms of vertigo.  If the Epley maneuver is done correctly, it is considered safe. You can do it up to 3 times a day. This information is not intended to replace advice given to you by your health care provider. Make sure you discuss any questions you have with your health care provider. Document Released: 12/31/2012 Document Revised: 11/16/2015 Document Reviewed: 11/16/2015 Elsevier Interactive Patient Education  2017 [employer].   IF you received an x-ray today, you will receive an invoice from [facility]. Please contact [facility] at [phone] with questions or concerns regarding your invoice.   IF you received labwork today, you will receive an invoice from [facility]. Please contact [facility] at [phone] with questions or concerns regarding your invoice.   Our billing staff will not be able to assist you with questions regarding bills from these companies.  You will be contacted with the lab results as soon as they are available. The fastest way to get your results is to activate your My Chart account. Instructions are located on the last page of this paperwork. If you have not heard from [place] regarding the results in 2 weeks, please contact this office.

## 2016-12-29 ENCOUNTER — Other Ambulatory Visit: Payer: Self-pay

## 2016-12-29 ENCOUNTER — Ambulatory Visit (INDEPENDENT_AMBULATORY_CARE_PROVIDER_SITE_OTHER): Payer: 59 | Admitting: Family Medicine

## 2016-12-29 ENCOUNTER — Encounter: Payer: Self-pay | Admitting: Family Medicine

## 2016-12-29 VITALS — BP 112/78 | HR 71 | Temp 98.9°F | Resp 16 | Wt 98.2 lb

## 2016-12-29 DIAGNOSIS — K1120 Sialoadenitis, unspecified: Secondary | ICD-10-CM

## 2016-12-29 DIAGNOSIS — R112 Nausea with vomiting, unspecified: Secondary | ICD-10-CM | POA: Diagnosis not present

## 2016-12-29 LAB — POCT URINE PREGNANCY: Preg Test, Ur: NEGATIVE

## 2016-12-29 MED ORDER — DOXYCYCLINE HYCLATE 100 MG PO TABS: 100.0000 mg | ORAL_TABLET | Freq: Two times a day (BID) | ORAL | 0 refills | Status: DC

## 2016-12-29 MED ORDER — IBUPROFEN 600 MG PO TABS: 600.0000 mg | ORAL_TABLET | Freq: Three times a day (TID) | ORAL | 0 refills | Status: DC | PRN

## 2016-12-29 NOTE — Patient Instructions (Addendum)
   IF you received an x-ray today, you will receive an invoice from [facility]. Please contact [facility] at [phone] with questions or concerns regarding your invoice.   IF you received labwork today, you will receive an invoice from [facility]. Please contact [facility] at [phone] with questions or concerns regarding your invoice.   Our billing staff will not be able to assist you with questions regarding bills from these companies.  You will be contacted with the lab results as soon as they are available. The fastest way to get your results is to activate your My Chart account. Instructions are located on the last page of this paperwork. If you have not heard from us regarding the results in 2 weeks, please contact this office.    Salivary Gland Infection A salivary gland infection is an infection in one or more of the glands that produce spit (saliva). You have six major salivary glands. Each gland has a duct that carries saliva into your mouth. Saliva keeps your mouth moist and breaks down the food that you eat. It also helps to prevent tooth decay. Two salivary glands are located just in front of your ears (parotid). The ducts for these glands open up inside your cheeks, near your back teeth. You also have two glands under your tongue (sublingual) and two glands under your jaw (submandibular). The ducts for these glands open under your tongue. Any salivary gland can become infected. Most infections occur in the parotid glands or submandibular glands. What are the causes? Salivary glands can be infected by viruses or bacteria.  The mumps virus is the most common cause of viral salivary gland infections, though mumps is now rare in many areas because of vaccination. ? This infection causes swelling in both parotid glands. ? Viral infections are more common in children.  The bacteria that cause salivary gland infections are usually the same bacteria that normally  live in your mouth. ? A stone can form in a salivary gland and block the flow of saliva. As a result, saliva backs up into the salivary gland. Bacteria may then start to grow behind the blockage and cause infection. ? Bacterial infections usually cause pain and swelling on one side of the face. Submandibular gland swelling occurs under the jaw. Parotid swelling occurs in front of the ear. ? Bacterial infections are more common in adults.  What increases the risk? Children who do not get the MMR (measles, mumps, rubella) vaccine are more likely to get mumps, which can cause a viral salivary gland infection. Risk factors for bacterial infections include:  Poor dental care (oral hygiene).  Smoking.  Not drinking enough water.  Having a disease that causes dry mouth and dry eyes (Mikulicz syndrome or Sjogren syndrome).  What are the signs or symptoms? The main sign of salivary gland infection is a swollen salivary gland. This type of inflammation is often called sialadenitis. You may have swelling in front of your ear, under your jaw, or under your tongue. Swelling may get worse when you eat and decrease after you eat. Other signs and symptoms include:  Pain.  Tenderness.  Redness.  Dry mouth.  Bad taste in your mouth.  Difficulty chewing and swallowing.  Fever.  How is this diagnosed? Your health care provider may suspect a salivary gland infection based on your signs and symptoms. He or she will also do a physical exam. The health care provider will look and feel inside your mouth to see whether a   stone is blocking a salivary gland duct. You may need to see an ear, nose, and throat specialist (ENT or otolaryngologist) for diagnosis and treatment. You may also need to have diagnostic tests, such as:  An X-ray to check for a stone.  Other imaging studies to look for an abscess and to rule out other causes of swelling. These tests may include: ? Ultrasound. ? CT  scan. ? MRI.  Culture and sensitivity test. This involves collecting a sample of pus for testing in the lab to see what bacteria grow and what antibiotics they are sensitive to. The testing sample may be: ? Swabbed from a salivary gland duct. ? Withdrawn from a swollen gland with a needle (aspiration).  How is this treated? Viral salivary gland infections usually clear up without treatment. Bacterial infections are usually treated with antibiotic medicine. Severe infections that cause difficulty with swallowing may be treated with an IV antibiotic in the hospital. Other treatments may include:  Probing and widening the salivary duct to allow a stone to pass. In some cases, a thin, flexible scope (endoscope) may be inserted into the duct to find a stone and remove it.  Breaking up a stone using sound waves.  Draining an infected gland (abscess) with a needle.  In some cases, you may need surgery so your health care provider can: ? Remove a stone. ? Drain pus from an abscess. ? Remove a badly infected gland.  Follow these instructions at home:  Take medicines only as directed by your health care provider.  If you were prescribed an antibiotic medicine, finish it all even if you start to feel better.  Follow these instructions every few hours: ? Suck on a lemon candy to stimulate the flow of saliva. ? Put a warm compress over the gland. ? Gently massage the gland.  Drink enough fluid to keep your urine clear or pale yellow.  Rinse your mouth with a mixture of warm water and salt every few hours. To make this mixture, add a pinch of salt to 1 cup of warm water.  Practice good oral hygiene by brushing and flossing your teeth after meals and before you go to bed.  Do not use any tobacco products, including cigarettes, chewing tobacco, or electronic cigarettes. If you need help quitting, ask your health care provider. Contact a health care provider if:  You have pain and swelling  in your face, jaw, or mouth after eating.  You have persistent swelling in any of these places: ? In front of your ear. ? Under your jaw. ? Inside your mouth. Get help right away if:  You have pain and swelling in your face, jaw, or mouth that are getting worse.  Your pain and swelling make it hard to swallow or breathe. This information is not intended to replace advice given to you by your health care provider. Make sure you discuss any questions you have with your health care provider. Document Released: 02/03/2004 Document Revised: 06/03/2015 Document Reviewed: 05/28/2013 Elsevier Interactive Patient Education  2018 Elsevier Inc.  

## 2016-12-29 NOTE — Progress Notes (Signed)
12/21/201811:02 AM  Misty Bauer 06/16/1985, 31 y.o. female [id number]  Chief Complaint  Patient presents with  . Cyst    lumps on the bottom of the chin noticed this am. Taking pregnancy test    HPI:   Patient is a 31 y.o. female who presents today for a lump she noted on bottom of her chin this morning. It is very tender. Has never had this before. Recovering from a cold. No fever or chills. No SOB.    Also requesting a pregnancy test, had a miscarriage earlier this year and since then her menses have been irregular, LMP 11/3016, delayed. C/o breast pain and nausea. Has not done home preg test.  [facility] 12/29/2016 11/06/2016 07/31/2016  Decreased Interest 1 1 1   Down, Depressed, Hopeless 2 1 0  PHQ - 2 Score 3 2 1   Altered sleeping 3 2 -  Tired, decreased energy 3 3 -  Change in appetite 1 1 -  Feeling bad or failure about yourself  1 1 -  Trouble concentrating 1 1 -  Moving slowly or fidgety/restless 1 1 -  Suicidal thoughts 0 0 -  PHQ-9 Score 13 11 -  Difficult doing work/chores Extremely dIfficult Somewhat difficult -    Allergies  Allergen Reactions  . Amoxicillin Rash  . Coconut Flavor Anaphylaxis, Shortness Of Breath and Swelling  . Penicillins Hives    Has patient had a PCN reaction causing immediate rash, facial/tongue/throat swelling, SOB or lightheadedness with hypotension:yes Has patient had a PCN reaction causing severe rash involving mucus membranes or skin necrosis: no Has patient had a PCN reaction that required hospitalization no Has patient had a PCN reaction occurring within the last 10 years:yes If all of the above answers are "NO", then may proceed with Cephalosporin use.     Prior to Admission medications   Medication Sig Start Date End Date Taking? Authorizing Provider  eletriptan (RELPAX) 20 MG tablet TAKE 1 TAB BY MOUTH AS NEEDED FOR MIGRAINE. MAY REPEAT IN 2 HRS IF PERSISTS. DO NOT TAKE OVER 3X/WK 07/20/16  Yes Van ClinesAquino,  Karen M, MD  meclizine (ANTIVERT) 32 MG tablet Take 1 tablet (32 mg total) by mouth 3 (three) times daily as needed. 11/06/16  Yes Barnett AbuWiseman, [place]ittany D, PA-C  nortriptyline (PAMELOR) 10 MG capsule Take 4 capsules at bedtime. 04/18/16  Yes Van ClinesAquino, Karen M, MD  ondansetron (ZOFRAN ODT) 4 MG disintegrating tablet Take 1 tablet (4 mg total) by mouth every 8 (eight) hours as needed for nausea or vomiting. 11/06/16  Yes Benjiman CoreWiseman, Brittany D, PA-C  Prenatal Vit-Fe Fumarate-FA (PRENATAL PLUS PO) Take 1 tablet by mouth daily.   Yes [provider]  celecoxib (CELEBREX) 200 MG capsule Take 1 capsule (200 mg total) by mouth 2 (two) times daily. Patient not taking: Reported on 12/29/2016 07/31/16   Wallis BambergMani, Mario, PA-C  cetirizine (ZYRTEC) 10 MG tablet Take 10 mg by mouth daily as needed for allergies.    [provider]  fluticasone (FLONASE) 50 MCG/ACT nasal spray Place into both nostrils daily. Reported on 02/03/2015    [provider]    Past Medical History:  Diagnosis Date  . Allergy   . Anxiety   . 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 Depression   . GERD (gastroesophageal reflux disease)    occasional - diet controlled - tums prn  . History of kidney stones    passes stones, no surgery required  .  ITP (idiopathic thrombocytopenic purpura)    In remission since 2008  . Migraine    last one 02/2016 - weather / smells are triggers    Past Surgical History:  Procedure Laterality Date  . DILATION AND EVACUATION N/A 05/03/2016   Procedure: DILATATION AND EVACUATION WITH  ULTRASOUND GUIDANCE ;  Surgeon: Olivia Mackieichard Taavon, MD;  Location: [facility];  Service: Gynecology;  Laterality: N/A;  NEEDS ULTRASOUND   . WISDOM TOOTH EXTRACTION  2014    Social History   Tobacco Use  . Smoking status: Current Every Day Smoker    Packs/day: 1.00    Years: 14.00    Pack years: 14.00    Types: Cigarettes  . Smokeless tobacco: Never Used  Substance Use Topics  .  Alcohol use: Yes    Alcohol/week: 0.0 oz    Comment: Beer/Liquor 4 drinks    Family History  Problem Relation Age of Onset  . Thyroid disease Mother   . Hyperlipidemia Maternal Grandmother     ROS Per HPI  OBJECTIVE:  There were no vitals taken for this visit.  Physical Exam  Constitutional: She is oriented to person, place, and time and well-developed, well-nourished, and in no distress.  HENT:  Head: Normocephalic and atraumatic.  Right Ear: Hearing, tympanic membrane, external ear and ear canal normal.  Left Ear: Hearing, tympanic membrane, external ear and ear canal normal.  Mouth/Throat: Oropharynx is clear and moist.  Submental area with a 1 cm firm mobile tender nodule. Skin wo erythema or warmth  Eyes: EOM are normal. Pupils are equal, round, and reactive to light.  Neck: Neck supple.  Cardiovascular: Normal rate, regular rhythm and normal heart sounds. Exam reveals no gallop and no friction rub.  No murmur heard. Pulmonary/Chest: Effort normal and breath sounds normal. She has no wheezes. She has no rales.  Lymphadenopathy:    She has cervical adenopathy.  Neurological: She is alert and oriented to person, place, and time. Gait normal.  Skin: Skin is warm and dry.      Results for orders placed or performed in visit on 12/29/16 (from the past 24 hour(s))  POCT urine pregnancy     Status: None   Collection Time: 12/29/16 11:06 AM  Result Value Ref Range   Preg Test, Ur Negative Negative     ASSESSMENT and PLAN 1. Sialadenitis Discussed conservative measures: warm compresses, NSAIDs, saliva stimulation with sour lemon drops/papaya chews. Given long holiday weekend providing abx in case needed, discussed ssx for which to start abx. US ordered if does not resolve. RTC precautions given.  - ibuprofen (ADVIL,MOTRIN) 600 MG tablet; Take 1 tablet (600 mg total) by mouth every 8 (eight) hours as needed. - doxycycline (VIBRA-TABS) 100 MG tablet; Take 1 tablet (100 mg  total) by mouth 2 (two) times daily. - US Soft Tissue Head/Neck; Future  2. Nausea and vomiting, intractability of vomiting not specified, unspecified vomiting type - POCT urine pregnancy - negative today. Discussed repeating in 2-3 weeks if no menses and symptoms continue     Return if symptoms worsen or fail to improve.    Myles LippsIrma M Santiago, MD [facility] [address] [place], [place] [address] Ph.  [phone] Fax [phone]

## 2017-03-06 ENCOUNTER — Encounter: Payer: Self-pay | Admitting: Family Medicine

## 2017-03-06 ENCOUNTER — Ambulatory Visit (INDEPENDENT_AMBULATORY_CARE_PROVIDER_SITE_OTHER): Payer: 59 | Admitting: Family Medicine

## 2017-03-06 ENCOUNTER — Ambulatory Visit (INDEPENDENT_AMBULATORY_CARE_PROVIDER_SITE_OTHER): Payer: 59

## 2017-03-06 VITALS — BP 120/70 | HR 97 | Temp 98.7°F | Resp 17 | Ht 63.5 in | Wt 101.0 lb

## 2017-03-06 DIAGNOSIS — M25551 Pain in right hip: Secondary | ICD-10-CM | POA: Diagnosis not present

## 2017-03-06 DIAGNOSIS — M545 Low back pain: Secondary | ICD-10-CM | POA: Diagnosis not present

## 2017-03-06 DIAGNOSIS — S301XXA Contusion of abdominal wall, initial encounter: Secondary | ICD-10-CM

## 2017-03-06 LAB — POCT URINE PREGNANCY: PREG TEST UR: NEGATIVE

## 2017-03-06 MED ORDER — MELOXICAM 7.5 MG PO TABS: 7.5000 mg | ORAL_TABLET | Freq: Every day | ORAL | 0 refills | Status: DC | PRN

## 2017-03-06 NOTE — Progress Notes (Signed)
Subjective:    Patient ID: Misty Bauer, female    DOB: 1985-08-31, 32 y.o.   MRN: [id number]  HPI Misty Bauer is a 32 y.o. female Presents today for: Chief Complaint  Patient presents with  . Hip Pain    right    1 week ago - swimming in pool. Did back flip in the water and felt hip hit the pool bottom. Felt a little twinge, but able to walk ok that day, no pain that day. Next morning more painful, had trouble weight bearing the next morning, but able to WB later in morning. Hurt to put pressure on bone at top of hip. No bruising or wound seen. 3 days ago felt some soreness in low R back and into buttock and thigh, sometimes into front of leg.  No bowel or bladder incontinence, no saddle anesthesia, no lower extremity weakness. No prior back or hip issue.   Attempted treatment: heat, ibuprofen 800mg  yesterday once - min relief.    Patient Active Problem List   Diagnosis Date Noted  . Cutaneous wart 08/19/2015  . Migraine with aura and without status migrainosus, not intractable 10/09/2014  . Worsening headaches 10/09/2014   Past Medical History:  Diagnosis Date  . Allergy   . Anxiety   . 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 Depression   . GERD (gastroesophageal reflux disease)    occasional - diet controlled - tums prn  . History of kidney stones    passes stones, no surgery required  . ITP (idiopathic thrombocytopenic purpura)    In remission since 2008  . Migraine    last one 02/2016 - weather / smells are triggers   Past Surgical History:  Procedure Laterality Date  . DILATION AND EVACUATION N/A 05/03/2016   Procedure: DILATATION AND EVACUATION WITH  ULTRASOUND GUIDANCE ;  Surgeon: Olivia Mackie, MD;  Location: [facility];  Service: Gynecology;  Laterality: N/A;  NEEDS ULTRASOUND   . WISDOM TOOTH EXTRACTION  2014   Allergies  Allergen Reactions  . Amoxicillin Rash  . Coconut Flavor Anaphylaxis, Shortness Of Breath and Swelling  .  Penicillins Hives    Has patient had a PCN reaction causing immediate rash, facial/tongue/throat swelling, SOB or lightheadedness with hypotension:yes Has patient had a PCN reaction causing severe rash involving mucus membranes or skin necrosis: no Has patient had a PCN reaction that required hospitalization no Has patient had a PCN reaction occurring within the last 10 years:yes If all of the above answers are "NO", then may proceed with Cephalosporin use.    Prior to Admission medications   Medication Sig Start Date End Date Taking? Authorizing Provider  cetirizine (ZYRTEC) 10 MG tablet Take 10 mg by mouth daily as needed for allergies.   Yes [provider]  eletriptan (RELPAX) 20 MG tablet TAKE 1 TAB BY MOUTH AS NEEDED FOR MIGRAINE. MAY REPEAT IN 2 HRS IF PERSISTS. DO NOT TAKE OVER 3X/WK 07/20/16  Yes Van Clines, MD  meclizine (ANTIVERT) 32 MG tablet Take 1 tablet (32 mg total) by mouth 3 (three) times daily as needed. 11/06/16  Yes Barnett Abu, [place] D, PA-C  nortriptyline (PAMELOR) 10 MG capsule Take 4 capsules at bedtime. 04/18/16  Yes Van Clines, MD  Prenatal Vit-Fe Fumarate-FA (PRENATAL PLUS PO) Take 1 tablet by mouth daily.   Yes [provider]   Social History   Socioeconomic History  . Marital status: Single    Spouse  name: Not on file  . Number of children: Not on file  . Years of education: Not on file  . Highest education level: Not on file  Social Needs  . Financial resource strain: Not on file  . Food insecurity - worry: Not on file  . Food insecurity - inability: Not on file  . Transportation needs - medical: Not on file  . Transportation needs - non-medical: Not on file  Occupational History  . Occupation: Mudlogger  Tobacco Use  . Smoking status: Current Every Day Smoker    Packs/day: 1.00    Years: 14.00    Pack years: 14.00    Types: Cigarettes  . Smokeless tobacco: Never Used  Substance and Sexual Activity  . Alcohol  use: Yes    Alcohol/week: 0.0 oz    Comment: Beer/Liquor 4 drinks  . Drug use: No  . Sexual activity: Yes    Partners: Male    Comment: MAB 02/2016 - still has heavy bleeding  Other Topics Concern  . Not on file  Social History Narrative  . Not on file    Review of Systems  Constitutional: Negative for chills and fever.       Objective:   Physical Exam  Constitutional: She appears well-developed and well-nourished. No distress.  Cardiovascular: Normal rate.  Pulmonary/Chest: Effort normal.  Abdominal: Soft. Bowel sounds are normal. There is no tenderness.  Musculoskeletal:       Right hip: She exhibits tenderness and bony tenderness (Tender to palpation over ASIS, slightly inferior. No ecchymosis, no soft tissue swelling, no wounds.). She exhibits normal range of motion (Pain-free internal/external rotation) and no deformity.       Lumbar back: She exhibits tenderness (Minimal tenderness right paraspinals, no midline bony tenderness. Full range motion., Negative straight leg raise seated and supine. Able to heel and toe walk without difficulty in back, but does have some discomfort R hip) and spasm (min R paraspinals. ). She exhibits normal range of motion.  Neurological:  Reflex Scores:      Patellar reflexes are 2+ on the right side and 2+ on the left side.      Achilles reflexes are 2+ on the right side and 2+ on the left side.  Vitals:   03/06/17 0921  BP: 120/70  Pulse: 97  Resp: 17  Temp: 98.7 F (37.1 C)  TempSrc: Oral  SpO2: 98%  Weight: 101 lb (45.8 kg)  Height: 5' 3.5" (1.613 m)  Dg Hip Unilat W Or W/o Pelvis 2-3 Views Right  Result Date: 03/06/2017 CLINICAL DATA:  Right hip pain since a contusion 1 week ago. EXAM: DG HIP (WITH OR WITHOUT PELVIS) 2-3V RIGHT COMPARISON:  None in PACs FINDINGS: The bony pelvis is subjectively adequately mineralized. There is no acute or healing pelvic fracture. AP and lateral views of the right hip reveal preservation of the joint  space. The articular surfaces of the femoral head and acetabulum remains smoothly rounded. The femoral neck, intertrochanteric, and immediate subtrochanteric regions are normal. IMPRESSION: There is no acute or significant chronic bony abnormality of the right hip. Electronically Signed   By: David  [place] M.D.   On: 03/06/2017 10:39       Assessment & Plan:    Misty Bauer is a 32 y.o. female Pain of right hip joint - Plan: DG HIP UNILAT W OR W/O PELVIS 2-3 VIEWS RIGHT, POCT urine pregnancy  Acute right-sided low back pain, with sciatica presence unspecified  Contusion of iliac crest,  initial encounter - Plan: meloxicam (MOBIC) 7.5 MG tablet  Suspected right hip contusion/hip pointer with possible low back strain at the same time with swimming. No red flags on exam or history. Reassuring x-ray.  - Continue symptomatic care, but change anti-inflammatory to meloxicam 7.5-15 mg daily, heat or ice, gentle range of motion and stretching and recheck in the next week to 10 days not improving. Sooner if worse. Handout given.   Meds ordered this encounter  Medications  . meloxicam (MOBIC) 7.5 MG tablet    Sig: Take 1-2 tablets (7.5-15 mg total) by mouth daily as needed for pain.    Dispense:  30 tablet    Refill:  0   Patient Instructions    I suspect you have a contusion of your right hip or hip pointer. Try meloxicam 1-2 pills per day, do not combine with other NSAIDs. Tylenol is okay if needed, heat or ice and gentle range of motion. If you're not improving in the next week to 10 days, return for recheck. Return sooner if worse.   Back Pain, Adult Many adults have back pain from time to time. Common causes of back pain include:  A strained muscle or ligament.  Wear and tear (degeneration) of the spinal disks.  Arthritis.  A hit to the back.  Back pain can be short-lived (acute) or last a long time (chronic). A physical exam, lab tests, and imaging studies may be done to find  the cause of your pain. Follow these instructions at home: Managing pain and stiffness  Take over-the-counter and prescription medicines only as told by your health care provider.  If directed, apply heat to the affected area as often as told by your health care provider. Use the heat source that your health care provider recommends, such as a moist heat pack or a heating pad. ? Place a towel between your skin and the heat source. ? Leave the heat on for 20-30 minutes. ? Remove the heat if your skin turns bright red. This is especially important if you are unable to feel pain, heat, or cold. You have a greater risk of getting burned.  If directed, apply ice to the injured area: ? Put ice in a plastic bag. ? Place a towel between your skin and the bag. ? Leave the ice on for 20 minutes, 2-3 times a day for the first 2-3 days. Activity  Do not stay in bed. Resting more than 1-2 days can delay your recovery.  Take short walks on even surfaces as soon as you are able. Try to increase the length of time you walk each day.  Do not sit, drive, or stand in one place for more than 30 minutes at a time. Sitting or standing for long periods of time can put stress on your back.  Use proper lifting techniques. When you bend and lift, use positions that put less stress on your back: ? [place] your knees. ? Keep the load close to your body. ? Avoid twisting.  Exercise regularly as told by your health care provider. Exercising will help your back heal faster. This also helps prevent back injuries by keeping muscles strong and flexible.  Your health care provider may recommend that you see a physical therapist. This person can help you come up with a safe exercise program. Do any exercises as told by your physical therapist. Lifestyle  Maintain a healthy weight. Extra weight puts stress on your back and makes it difficult to have good  posture.  Avoid activities or situations that make you feel anxious  or stressed. Learn ways to manage anxiety and stress. One way to manage stress is through exercise. Stress and anxiety increase muscle tension and can make back pain worse. General instructions  Sleep on a firm mattress in a comfortable position. Try lying on your side with your knees slightly bent. If you lie on your back, put a pillow under your knees.  Follow your treatment plan as told by your health care provider. This may include: ? Cognitive or behavioral therapy. ? Acupuncture or massage therapy. ? Meditation or yoga. Contact a health care provider if:  You have pain that is not relieved with rest or medicine.  You have increasing pain going down into your legs or buttocks.  Your pain does not improve in 2 weeks.  You have pain at night.  You lose weight.  You have a fever or chills. Get help right away if:  You develop new bowel or bladder control problems.  You have unusual weakness or numbness in your arms or legs.  You develop nausea or vomiting.  You develop abdominal pain.  You feel faint. Summary  Many adults have back pain from time to time. A physical exam, lab tests, and imaging studies may be done to find the cause of your pain.  Use proper lifting techniques. When you bend and lift, use positions that put less stress on your back.  Take over-the-counter and prescription medicines and apply heat or ice as directed by your health care provider. This information is not intended to replace advice given to you by your health care provider. Make sure you discuss any questions you have with your health care provider. Document Released: 12/26/2004 Document Revised: 01/31/2016 Document Reviewed: 01/31/2016 Elsevier Interactive Patient Education  2018 [employer].  Hip Pointer A hip pointer is a deep bruise (contusion) of the top of the hip bone (iliac crest). Contusions happen because of an injury that causes bleeding under the skin and damages soft tissue  and muscle. This condition usually goes away after a few days or a few weeks. Hip pointers can be mild and almost painless, or severe and very painful. What are the causes? This condition is caused by a hard, direct hit or injury (trauma) to the hip area. What are the signs or symptoms? Symptoms of this condition may include:  Swelling and bruising of the hip area. Bruising can turn red, blue, purple, or yellow.  Pain and soreness in the hip area. Pain may get worse during physical activity, such as walking or twisting from side to side. ? A mild hip pointer may be almost painless. ? A severe hip pointer can stay painful and swollen for up to 2 weeks.  How is this diagnosed? This condition is diagnosed based on a physical exam and your medical history. You may need X-rays to look for a broken bone (fracture). How is this treated? This condition is treated by resting and icing the injured area. Your health care provider may recommend over-the-counter medicines to help relieve pain. You may need a compression wrap, crutches, or physical therapy. In rare cases, your health care provider may use a needle to remove excess blood or fluid from your injured area. Follow these instructions at home: Activity  Return to your normal activities as told by your health care provider. Ask your health care provider what activities are safe for you.  Do exercises and stretches as told by your  health care provider.  Rest your hip as much as possible until your symptoms improve.  Avoid activities that cause pain. General instructions  Take over-the-counter and prescription medicines only as told by your health care provider.  If directed, apply ice to the injured area. ? Put ice in a plastic bag. ? Place a towel between your skin and the bag. ? Leave the ice on for 20 minutes, 2-3 times per day.  If directed, apply heat to the affected area before you exercise or as often as told by your health care  provider. Use the heat source that your health care provider recommends, such as a moist heat pack or a heating pad. ? Place a towel between your skin and the heat source. ? Leave the heat on for 20-30 minutes. ? Remove the heat if your skin turns bright red. This is especially important if you are unable to feel pain, heat, or cold. You may have a greater risk of getting burned.  If you were given crutches, use them as told by your health care provider.  Wear compression wraps as told by your health care provider.  Keep all follow-up visits as told by your health care provider. This is important. How is this prevented?  Wear protective gear while exercising and playing contact sports.  Maintain muscle strength and flexibility, especially in the hip area. Contact a health care provider if:  You have bruising or swelling that gets worse.  You have pain that gets worse or does not get better with medicine.  You have symptoms that last longer than 2 weeks.  You develop new symptoms. Get help right away if:  You have numbness or a "pins and needles" feeling in your feet or your legs.  You are unable to walk because of your pain. This information is not intended to replace advice given to you by your health care provider. Make sure you discuss any questions you have with your health care provider. Document Released: 09/20/2000 Document Revised: 06/03/2015 Document Reviewed: 05/13/2014 Elsevier Interactive Patient Education  2018 [employer].    IF you received an x-ray today, you will receive an invoice from [facility]. Please contact [facility] at [phone] with questions or concerns regarding your invoice.   IF you received labwork today, you will receive an invoice from [facility]. Please contact [facility] at [phone] with questions or concerns regarding your invoice.   Our billing staff will not be able to assist you with questions regarding bills from  these companies.  You will be contacted with the lab results as soon as they are available. The fastest way to get your results is to activate your My Chart account. Instructions are located on the last page of this paperwork. If you have not heard from [place] regarding the results in 2 weeks, please contact this office.       I personally performed the services described in this documentation, which was scribed in my presence. The recorded information has been reviewed and considered for accuracy and completeness, addended by me as needed, and agree with information above.  Signed,   Meredith Staggers, MD [facility] at [facility].  03/06/17 10:49 AM

## 2017-03-06 NOTE — Patient Instructions (Addendum)
I suspect you have a contusion of your right hip or hip pointer. Try meloxicam 1-2 pills per day, do not combine with other NSAIDs. Tylenol is okay if needed, heat or ice and gentle range of motion. If you're not improving in the next week to 10 days, return for recheck. Return sooner if worse.   Back Pain, Adult Many adults have back pain from time to time. Common causes of back pain include:  A strained muscle or ligament.  Wear and tear (degeneration) of the spinal disks.  Arthritis.  A hit to the back.  Back pain can be short-lived (acute) or last a long time (chronic). A physical exam, lab tests, and imaging studies may be done to find the cause of your pain. Follow these instructions at home: Managing pain and stiffness  Take over-the-counter and prescription medicines only as told by your health care provider.  If directed, apply heat to the affected area as often as told by your health care provider. Use the heat source that your health care provider recommends, such as a moist heat pack or a heating pad. ? Place a towel between your skin and the heat source. ? Leave the heat on for 20-30 minutes. ? Remove the heat if your skin turns bright red. This is especially important if you are unable to feel pain, heat, or cold. You have a greater risk of getting burned.  If directed, apply ice to the injured area: ? Put ice in a plastic bag. ? Place a towel between your skin and the bag. ? Leave the ice on for 20 minutes, 2-3 times a day for the first 2-3 days. Activity  Do not stay in bed. Resting more than 1-2 days can delay your recovery.  Take short walks on even surfaces as soon as you are able. Try to increase the length of time you walk each day.  Do not sit, drive, or stand in one place for more than 30 minutes at a time. Sitting or standing for long periods of time can put stress on your back.  Use proper lifting techniques. When you bend and lift, use positions that  put less stress on your back: ? Nadine your knees. ? Keep the load close to your body. ? Avoid twisting.  Exercise regularly as told by your health care provider. Exercising will help your back heal faster. This also helps prevent back injuries by keeping muscles strong and flexible.  Your health care provider may recommend that you see a physical therapist. This person can help you come up with a safe exercise program. Do any exercises as told by your physical therapist. Lifestyle  Maintain a healthy weight. Extra weight puts stress on your back and makes it difficult to have good posture.  Avoid activities or situations that make you feel anxious or stressed. Learn ways to manage anxiety and stress. One way to manage stress is through exercise. Stress and anxiety increase muscle tension and can make back pain worse. General instructions  Sleep on a firm mattress in a comfortable position. Try lying on your side with your knees slightly bent. If you lie on your back, put a pillow under your knees.  Follow your treatment plan as told by your health care provider. This may include: ? Cognitive or behavioral therapy. ? Acupuncture or massage therapy. ? Meditation or yoga. Contact a health care provider if:  You have pain that is not relieved with rest or medicine.  You have increasing  pain going down into your legs or buttocks.  Your pain does not improve in 2 weeks.  You have pain at night.  You lose weight.  You have a fever or chills. Get help right away if:  You develop new bowel or bladder control problems.  You have unusual weakness or numbness in your arms or legs.  You develop nausea or vomiting.  You develop abdominal pain.  You feel faint. Summary  Many adults have back pain from time to time. A physical exam, lab tests, and imaging studies may be done to find the cause of your pain.  Use proper lifting techniques. When you bend and lift, use positions that put  less stress on your back.  Take over-the-counter and prescription medicines and apply heat or ice as directed by your health care provider. This information is not intended to replace advice given to you by your health care provider. Make sure you discuss any questions you have with your health care provider. Document Released: 12/26/2004 Document Revised: 01/31/2016 Document Reviewed: 01/31/2016 Elsevier Interactive Patient Education  2018 [employer]  Hip Pointer A hip pointer is a deep bruise (contusion) of the top of the hip bone (iliac crest). Contusions happen because of an injury that causes bleeding under the skin and damages soft tissue and muscle. This condition usually goes away after a few days or a few weeks. Hip pointers can be mild and almost painless, or severe and very painful. What are the causes? This condition is caused by a hard, direct hit or injury (trauma) to the hip area. What are the signs or symptoms? Symptoms of this condition may include:  Swelling and bruising of the hip area. Bruising can turn red, blue, purple, or yellow.  Pain and soreness in the hip area. Pain may get worse during physical activity, such as walking or twisting from side to side. ? A mild hip pointer may be almost painless. ? A severe hip pointer can stay painful and swollen for up to 2 weeks.  How is this diagnosed? This condition is diagnosed based on a physical exam and your medical history. You may need X-rays to look for a broken bone (fracture). How is this treated? This condition is treated by resting and icing the injured area. Your health care provider may recommend over-the-counter medicines to help relieve pain. You may need a compression wrap, crutches, or physical therapy. In rare cases, your health care provider may use a needle to remove excess blood or fluid from your injured area. Follow these instructions at home: Activity  Return to your normal activities as told by  your health care provider. Ask your health care provider what activities are safe for you.  Do exercises and stretches as told by your health care provider.  Rest your hip as much as possible until your symptoms improve.  Avoid activities that cause pain. General instructions  Take over-the-counter and prescription medicines only as told by your health care provider.  If directed, apply ice to the injured area. ? Put ice in a plastic bag. ? Place a towel between your skin and the bag. ? Leave the ice on for 20 minutes, 2-3 times per day.  If directed, apply heat to the affected area before you exercise or as often as told by your health care provider. Use the heat source that your health care provider recommends, such as a moist heat pack or a heating pad. ? Place a towel between your skin and  the heat source. ? Leave the heat on for 20-30 minutes. ? Remove the heat if your skin turns bright red. This is especially important if you are unable to feel pain, heat, or cold. You may have a greater risk of getting burned.  If you were given crutches, use them as told by your health care provider.  Wear compression wraps as told by your health care provider.  Keep all follow-up visits as told by your health care provider. This is important. How is this prevented?  Wear protective gear while exercising and playing contact sports.  Maintain muscle strength and flexibility, especially in the hip area. Contact a health care provider if:  You have bruising or swelling that gets worse.  You have pain that gets worse or does not get better with medicine.  You have symptoms that last longer than 2 weeks.  You develop new symptoms. Get help right away if:  You have numbness or a "pins and needles" feeling in your feet or your legs.  You are unable to walk because of your pain. This information is not intended to replace advice given to you by your health care provider. Make sure you  discuss any questions you have with your health care provider. Document Released: 09/20/2000 Document Revised: 06/03/2015 Document Reviewed: 05/13/2014 Elsevier Interactive Patient Education  2018 [employer].    IF you received an x-ray today, you will receive an invoice from [facility]. Please contact [facility] at [phone] with questions or concerns regarding your invoice.   IF you received labwork today, you will receive an invoice from [facility]. Please contact [facility] at [phone] with questions or concerns regarding your invoice.   Our billing staff will not be able to assist you with questions regarding bills from these companies.  You will be contacted with the lab results as soon as they are available. The fastest way to get your results is to activate your My Chart account. Instructions are located on the last page of this paperwork. If you have not heard from [place] regarding the results in 2 weeks, please contact this office.

## 2017-04-20 ENCOUNTER — Encounter: Payer: Self-pay | Admitting: Neurology

## 2017-04-20 ENCOUNTER — Other Ambulatory Visit: Payer: Self-pay

## 2017-04-20 ENCOUNTER — Ambulatory Visit (INDEPENDENT_AMBULATORY_CARE_PROVIDER_SITE_OTHER): Payer: 59 | Admitting: Neurology

## 2017-04-20 VITALS — BP 116/60 | HR 94 | Ht 62.0 in | Wt 101.0 lb

## 2017-04-20 DIAGNOSIS — G43109 Migraine with aura, not intractable, without status migrainosus: Secondary | ICD-10-CM | POA: Diagnosis not present

## 2017-04-20 MED ORDER — KETOROLAC TROMETHAMINE 60 MG/2ML IM SOLN
60.0000 mg | Freq: Once | INTRAMUSCULAR | Status: AC
  Administered 2017-04-20: 60 mg via INTRAMUSCULAR

## 2017-04-20 MED ORDER — METOCLOPRAMIDE HCL 5 MG/ML IJ SOLN
10.0000 mg | Freq: Once | INTRAVENOUS | Status: AC
  Administered 2017-04-20: 10 mg via INTRAMUSCULAR

## 2017-04-20 MED ORDER — DIPHENHYDRAMINE HCL 50 MG/ML IJ SOLN
12.5000 mg | Freq: Once | INTRAMUSCULAR | Status: AC
  Administered 2017-04-20: 12.5 mg via INTRAMUSCULAR

## 2017-04-20 MED ORDER — NORTRIPTYLINE HCL 10 MG PO CAPS: ORAL_CAPSULE | ORAL | 3 refills | Status: DC

## 2017-04-20 MED ORDER — ELETRIPTAN HYDROBROMIDE 20 MG PO TABS: ORAL_TABLET | ORAL | 11 refills | Status: DC

## 2017-04-20 NOTE — Patient Instructions (Signed)
Great seeing you! Continue nortriptyline 40mg  at bedtime and Relpax as needed. Continue to keep migraine calendar. We will do a migraine cocktail today to help with your current migraine. Follow-up in 1 year, call for any changes. Congratulations!

## 2017-04-20 NOTE — Progress Notes (Signed)
NEUROLOGY FOLLOW UP OFFICE NOTE  Misty Bauer [id number]  DOB: 03-02-1985  HISTORY OF PRESENT ILLNESS: I had the pleasure of seeing Misty Bauer in follow-up in the neurology clinic on 04/20/2017. She is accompanied by her fiance who helps supplement the history today. The patient was last seen a year ago for worsening headaches with daily mild headaches and migraine exacerbations 1-2 times a month. She continues on nortriptyline 40mg  qhs and is satisfied with migraine frequency of around one a month. She woke up with a migraine today and vomited twice this morning. She takes prn Relpax with overall good effect. No side effects to nortriptyline. She denies any dizziness, vision changes, focal numbness/tingling/weakness, no falls.   HPI: This is a pleasant 32 yo LH woman with a strong family history of migraines (mother, 2 maternal aunts, 2 sisters) and personal history of migraines since age 24 or 45, who presented for headache worsening over the past 4-5 years. She reports that since 2010 or 2011, she has been having a constant daily low grade 1 to 2 over 10 headache. She has migraines 1-2 times a month, starting at the base of her neck, then radiating up diffusely, with pressure-like 10/10 pain with associated nausea, photo and phonophobia, as well as significant sensitivity to smells. She usually has visual changes prior to migraines, such as seeing spots, or her vision briefly blackening then returning to normal. The last time she vomited from migraines was 2 years ago. She took Imitrex and Fioricet in the past, which did not help. She has been taking Excedrin on a near daily basis (around 6 or 7 doses a week) for the past couple of years. This has helped take the edge off. Poor sleep is also a trigger, she usually gets 5-6 hours of sleep on average. She denies taking any headache preventative medication in the past. She stopped birth control and has had only 2 menstrual periods, both times  seemed to have had more migraines. They are trying to get pregnant.   PAST MEDICAL HISTORY: Past Medical History:  Diagnosis Date  . Allergy   . Anxiety   . 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 Depression   . GERD (gastroesophageal reflux disease)    occasional - diet controlled - tums prn  . History of kidney stones    passes stones, no surgery required  . ITP (idiopathic thrombocytopenic purpura)    In remission since 2008  . Migraine    last one 02/2016 - weather / smells are triggers    MEDICATIONS: Current Outpatient Medications on File Prior to Visit  Medication Sig Dispense Refill  . cetirizine (ZYRTEC) 10 MG tablet Take 10 mg by mouth daily as needed for allergies.    Marland Kitchen eletriptan (RELPAX) 20 MG tablet TAKE 1 TAB BY MOUTH AS NEEDED FOR MIGRAINE. MAY REPEAT IN 2 HRS IF PERSISTS. DO NOT TAKE OVER 3X/WK 10 tablet 2  . meclizine (ANTIVERT) 32 MG tablet Take 1 tablet (32 mg total) by mouth 3 (three) times daily as needed. 30 tablet 0  . meloxicam (MOBIC) 7.5 MG tablet Take 1-2 tablets (7.5-15 mg total) by mouth daily as needed for pain. 30 tablet 0  . nortriptyline (PAMELOR) 10 MG capsule Take 4 capsules at bedtime. 360 capsule 3  . Prenatal Vit-Fe Fumarate-FA (PRENATAL PLUS PO) Take 1 tablet by mouth daily.     No current facility-administered medications on file prior to visit.  ALLERGIES: Allergies  Allergen Reactions  . Amoxicillin Rash  . Coconut Flavor Anaphylaxis, Shortness Of Breath and Swelling  . Penicillins Hives    Has patient had a PCN reaction causing immediate rash, facial/tongue/throat swelling, SOB or lightheadedness with hypotension:yes Has patient had a PCN reaction causing severe rash involving mucus membranes or skin necrosis: no Has patient had a PCN reaction that required hospitalization no Has patient had a PCN reaction occurring within the last 10 years:yes If all of the above answers are "NO", then may proceed with  Cephalosporin use.     FAMILY HISTORY: Family History  Problem Relation Age of Onset  . Thyroid disease Mother   . Hyperlipidemia Maternal Grandmother     SOCIAL HISTORY: Social History   Socioeconomic History  . Marital status: Single    Spouse name: Not on file  . Number of children: Not on file  . Years of education: Not on file  . Highest education level: Not on file  Occupational History  . Occupation: Physicist, medical  . Financial resource strain: Not on file  . Food insecurity:    Worry: Not on file    Inability: Not on file  . Transportation needs:    Medical: Not on file    Non-medical: Not on file  Tobacco Use  . Smoking status: Current Every Day Smoker    Packs/day: 1.00    Years: 14.00    Pack years: 14.00    Types: Cigarettes  . Smokeless tobacco: Never Used  Substance and Sexual Activity  . Alcohol use: Yes    Alcohol/week: 0.0 oz    Comment: Beer/Liquor 4 drinks  . Drug use: No  . Sexual activity: Yes    Partners: Male    Comment: MAB 02/2016 - still has heavy bleeding  Lifestyle  . Physical activity:    Days per week: Not on file    Minutes per session: Not on file  . Stress: Not on file  Relationships  . Social connections:    Talks on phone: Not on file    Gets together: Not on file    Attends religious service: Not on file    Active member of club or organization: Not on file    Attends meetings of clubs or organizations: Not on file    Relationship status: Not on file  . Intimate partner violence:    Fear of current or ex partner: Not on file    Emotionally abused: Not on file    Physically abused: Not on file    Forced sexual activity: Not on file  Other Topics Concern  . Not on file  Social History Narrative  . Not on file    REVIEW OF SYSTEMS: Constitutional: No fevers, chills, or sweats, no generalized fatigue, change in appetite Eyes: No visual changes, double vision, eye pain Ear, nose and throat: No  hearing loss, ear pain, nasal congestion, sore throat Cardiovascular: No chest pain, palpitations Respiratory:  No shortness of breath at rest or with exertion, wheezes GastrointestinaI: No nausea, vomiting, diarrhea, abdominal pain, fecal incontinence Genitourinary:  No dysuria, urinary retention or frequency Musculoskeletal:  No neck pain, back pain Integumentary: No rash, pruritus, skin lesions Neurological: as above Psychiatric: No depression, insomnia, anxiety Endocrine: No palpitations, fatigue, diaphoresis, mood swings, change in appetite, change in weight, increased thirst Hematologic/Lymphatic:  No anemia, purpura, petechiae. Allergic/Immunologic: no itchy/runny eyes, nasal congestion, recent allergic reactions, rashes  PHYSICAL EXAM: Vitals:   04/20/17 1606  BP: 116/60  Pulse: 94  SpO2: 99%   General: No acute distress Head:  Normocephalic/atraumatic Neck: supple, no paraspinal tenderness, full range of motion Heart:  Regular rate and rhythm Lungs:  Clear to auscultation bilaterally Back: No paraspinal tenderness Skin/Extremities: No rash, no edema Neurological Exam: alert and oriented to person, place, and time. No aphasia or dysarthria. Fund of knowledge is appropriate.  Recent and remote memory are intact.  Attention and concentration are normal.    Able to name objects and repeat phrases. Cranial nerves: Pupils equal, round, reactive to light.  Fundoscopic exam unremarkable, no papilledema. Extraocular movements intact with no nystagmus. Visual fields full. Facial sensation intact. No facial asymmetry. Tongue, uvula, palate midline.  Motor: Bulk and tone normal, muscle strength 5/5 throughout with no pronator drift.  Sensation to light touch, temperature and vibration intact.  No extinction to double simultaneous stimulation.  Deep tendon reflexes 2+ throughout, toes downgoing.  Finger to nose testing intact.  Gait narrow-based and steady, able to tandem walk  adequately.  IMPRESSION: This is a pleasant 32 yo LH woman with a history of migraines since childhood, who presented with daily headaches and migraine exacerbations occurring 1-2 times a month. She is satisfied with current migraine control on nortriptyline 40mg  qhs and prn Relpax. She woke up with a migraine today and vomited twice, migraine cocktail will be given in office today. No pregnancy plans, we discussed that we may taper down nortriptyline when they plan for pregnancy. She will continue to keep a calendar of her headaches and follow-up in 1 year. She knows to call our office for any changes.   Thank you for allowing me to participate in her care.  Please do not hesitate to call for any questions or concerns.  The duration of this appointment visit was 15 minutes of face-to-face time with the patient.  Greater than 50% of this time was spent in counseling, explanation of diagnosis, planning of further management, and coordination of care.   Patrcia DollyKaren Aquino, M.D.

## 2017-04-25 ENCOUNTER — Encounter: Payer: Self-pay | Admitting: Neurology

## 2017-08-13 ENCOUNTER — Ambulatory Visit (INDEPENDENT_AMBULATORY_CARE_PROVIDER_SITE_OTHER): Payer: 59 | Admitting: Physician Assistant

## 2017-08-13 ENCOUNTER — Other Ambulatory Visit: Payer: Self-pay

## 2017-08-13 ENCOUNTER — Encounter: Payer: Self-pay | Admitting: Physician Assistant

## 2017-08-13 VITALS — BP 131/84 | HR 92 | Temp 99.1°F | Resp 18 | Ht 62.0 in | Wt 103.8 lb

## 2017-08-13 DIAGNOSIS — M545 Low back pain, unspecified: Secondary | ICD-10-CM

## 2017-08-13 DIAGNOSIS — M62830 Muscle spasm of back: Secondary | ICD-10-CM | POA: Diagnosis not present

## 2017-08-13 LAB — POCT URINALYSIS DIP (MANUAL ENTRY)
Bilirubin, UA: NEGATIVE
Blood, UA: NEGATIVE
Glucose, UA: NEGATIVE mg/dL
Ketones, POC UA: NEGATIVE mg/dL
Leukocytes, UA: NEGATIVE
Nitrite, UA: NEGATIVE
Protein Ur, POC: NEGATIVE mg/dL
Spec Grav, UA: 1.02 (ref 1.010–1.025)
Urobilinogen, UA: 0.2 U/dL
pH, UA: 6 (ref 5.0–8.0)

## 2017-08-13 MED ORDER — CYCLOBENZAPRINE HCL 10 MG PO TABS: 10.0000 mg | ORAL_TABLET | Freq: Three times a day (TID) | ORAL | 1 refills | Status: DC | PRN

## 2017-08-13 NOTE — Patient Instructions (Addendum)
Flexeril is a muscle relaxer. Please take this only as prescribed.   Apply moist heat to the area. Wet a towel and wring it out so it is damp. Put it in the microwave for about 15-20 seconds - long enough to make it hot, but not too hot to apply to your skin causing burns. Do this for about 20-30 minutes, 3-4 times a day.   Perform gentle, light stretches 2-3 times a day.   Put a tennis ball between your back and a wall. Gentle massage the area with rolling the ball around the affected area. Try a foam roller or trigger point back massager.   Stay well hydrated - try to drink 32-64 oz/day.   If you feel like you need a new pillow, try "My Pillow".  Come back and see me if you would like to try a dry needling session. (see below).    Trigger Point Dry Needling   What is Trigger Point Dry Needling (DN)?   1. DN is a physical therapy technique used to treat muscle pain and Dysfunction.  Specifically, DN helps deactivate muscle trigger points (Muscle Knots).   2. A thin filiform needle is used to penetrate the skin and stimulate the underlying trigger point.  The goal is for a local twitch response (LTR) to occur and for the trigger point to relax.  No medication of any kind is injected during the procedure.   What Does Trigger Point Dry Needling Feel Like?   1. The procedures feels different for each individual patient.   Some patients report that they do not actually feel the needle enter the skin and overall the process is not  painful.  Very  mild bleeding may occur.  However, many patients feel a deep cramping in the muscle in which the needle was inserted. This is the local twitch response.    How Will I Feel After The Treatment?   1. Soreness is normal, and the onset of soreness may not occur for a few hours.  Typically this soreness does not last longer than two days.   2. Bruising is uncommon, however; ice can be used to decrease any possible bruising.   3. In rare cases feeling  tired or nauseous after the treatment is normal.  In addition, your symptoms may get worse before they get better, this period will typically not last longer than 24 hours.   What Can I do After My Treatment?   1.  Increase your hydration by drinking more water for the next 24 hours.   2.  You may place ice or heat on the areas treated that have become sore, however don not use heat on inflamed or bruised areas.  Heat often brings more relief post needling.   3. You can continue your regular activities, but vigorous activity is not recommended initially after the treatment for 24 hours.   4. DN is best combined with other physical therapy such as strengthening, stretching, and other therapies.    IF you received an x-ray today, you will receive an invoice from [facility]. Please contact [facility] at [phone] with questions or concerns regarding your invoice.   IF you received labwork today, you will receive an invoice from [facility]. Please contact [facility] at [phone] with questions or concerns regarding your invoice.   Our billing staff will not be able to assist you with questions regarding bills from these companies.  You will be contacted with the lab results as soon as  they are available. The fastest way to get your results is to activate your My Chart account. Instructions are located on the last page of this paperwork. If you have not heard from [place] regarding the results in 2 weeks, please contact this office.        IF you received an x-ray today, you will receive an invoice from [facility]. Please contact [facility] at [phone] with questions or concerns regarding your invoice.   IF you received labwork today, you will receive an invoice from [facility]. Please contact [facility] at [phone] with questions or concerns regarding your invoice.   Our billing staff will not be able to assist you with questions regarding bills from  these companies.  You will be contacted with the lab results as soon as they are available. The fastest way to get your results is to activate your My Chart account. Instructions are located on the last page of this paperwork. If you have not heard from [place] regarding the results in 2 weeks, please contact this office.

## 2017-08-13 NOTE — Progress Notes (Signed)
Misty Bauer  MRN: [id number] DOB: 03/27/1985  PCP: Patient, No Pcp Per  Subjective:  Pt is a left-handed 32 year old female who presents to clinic for back pain x 2 days. Pain is of her lower back and radiates around her side to the stomach. "feels like a pulled muscle".   Started soft ball season last week. She bats right-handed.  She has tried ibuprofen and Meloxicam and heat - nothing is helping.  Denies urinary symptoms, reduced range of motion muscle weakness,, saddle paresthesia, urine or bowel incontinence.  Review of Systems  Genitourinary: Negative for dysuria, frequency, hematuria, urgency, vaginal bleeding, vaginal discharge and vaginal pain.  Musculoskeletal: Positive for back pain. Negative for arthralgias and gait problem.  Skin: Negative.     Patient Active Problem List   Diagnosis Date Noted  . Cutaneous wart 08/19/2015  . Migraine with aura and without status migrainosus, not intractable 10/09/2014  . Worsening headaches 10/09/2014    Current Outpatient Medications on File Prior to Visit  Medication Sig Dispense Refill  . cetirizine (ZYRTEC) 10 MG tablet Take 10 mg by mouth daily as needed for allergies.    Marland Kitchen. eletriptan (RELPAX) 20 MG tablet TAKE 1 TAB BY MOUTH AS NEEDED FOR MIGRAINE. MAY REPEAT IN 2 HRS IF PERSISTS. DO NOT TAKE OVER 3X/WK 10 tablet 11  . meclizine (ANTIVERT) 32 MG tablet Take 1 tablet (32 mg total) by mouth 3 (three) times daily as needed. 30 tablet 0  . meloxicam (MOBIC) 7.5 MG tablet Take 1-2 tablets (7.5-15 mg total) by mouth daily as needed for pain. 30 tablet 0  . nortriptyline (PAMELOR) 10 MG capsule Take 4 capsules at bedtime. 360 capsule 3  . Prenatal Vit-Fe Fumarate-FA (PRENATAL PLUS PO) Take 1 tablet by mouth daily.     No current facility-administered medications on file prior to visit.     Allergies  Allergen Reactions  . Amoxicillin Rash  . Coconut Flavor Anaphylaxis, Shortness Of Breath and Swelling  . Penicillins  Hives    Has patient had a PCN reaction causing immediate rash, facial/tongue/throat swelling, SOB or lightheadedness with hypotension:yes Has patient had a PCN reaction causing severe rash involving mucus membranes or skin necrosis: no Has patient had a PCN reaction that required hospitalization no Has patient had a PCN reaction occurring within the last 10 years:yes If all of the above answers are "NO", then may proceed with Cephalosporin use.      Objective:  BP 131/84   Pulse 92   Temp 99.1 F (37.3 C) (Oral)   Resp 18   Ht 5\' 2"  (1.575 m)   Wt 103 lb 12.8 oz (47.1 kg)   LMP 07/22/2017   SpO2 99%   BMI 18.99 kg/m   Physical Exam  Constitutional: She is oriented to person, place, and time. No distress.  Abdominal: Soft. Normal appearance and bowel sounds are normal. There is no tenderness.  Musculoskeletal:       Lumbar back: She exhibits tenderness and spasm. She exhibits normal range of motion, no bony tenderness and no pain.       Back:  Neurological: She is alert and oriented to person, place, and time.  Skin: Skin is warm and dry.  Psychiatric: Judgment normal.  Vitals reviewed.   Results for orders placed or performed in visit on 08/13/17  POCT urinalysis dipstick  Result Value Ref Range   Color, UA yellow yellow   Clarity, UA clear clear   Glucose, UA negative negative mg/dL  Bilirubin, UA negative negative   Ketones, POC UA negative negative mg/dL   Spec Grav, UA 2.130 8.657 - 1.025   Blood, UA negative negative   pH, UA 6.0 5.0 - 8.0   Protein Ur, POC negative negative mg/dL   Urobilinogen, UA 0.2 0.2 or 1.0 E.U./dL   Nitrite, UA Negative Negative   Leukocytes, UA Negative Negative   A trigger point injection was performed at the site of maximal tenderness. This was well tolerated, and followed by mild relief of pain.  Assessment and Plan :  1. Right-sided low back pain without sciatica, unspecified chronicity 2. Back muscle spasm -Patient presents  with right-sided back pain.  No red flags.  UA is negative.  Physical exam and HPI suggests musculoskeletal pain, especially since patient recently started in a softball league.  Dry needling performed with mild improvement of symptoms.  Advised heat, massage, stretching.  We will try Flexeril.  Return to clinic if no improvement of symptoms.  Consider referral to physical therapy. - POCT urinalysis dipstick - cyclobenzaprine (FLEXERIL) 10 MG tablet; Take 1 tablet (10 mg total) by mouth 3 (three) times daily as needed for muscle spasms.  Dispense: 30 tablet; Refill: 1   Whitney Khady Vandenberg, PA-C  [facility] 08/13/2017 5:07 PM  Please note: Portions of this report may have been transcribed using dragon voice recognition software. Every effort was made to ensure accuracy; however, inadvertent computerized transcription errors may be present.

## 2017-08-14 LAB — URINE CULTURE

## 2017-10-19 ENCOUNTER — Other Ambulatory Visit: Payer: Self-pay

## 2017-10-19 ENCOUNTER — Ambulatory Visit (INDEPENDENT_AMBULATORY_CARE_PROVIDER_SITE_OTHER): Payer: 59 | Admitting: Emergency Medicine

## 2017-10-19 ENCOUNTER — Encounter: Payer: Self-pay | Admitting: Emergency Medicine

## 2017-10-19 VITALS — BP 130/82 | HR 79 | Temp 98.0°F | Resp 16 | Ht 62.99 in | Wt 105.0 lb

## 2017-10-19 DIAGNOSIS — B078 Other viral warts: Secondary | ICD-10-CM

## 2017-10-19 DIAGNOSIS — M79645 Pain in left finger(s): Secondary | ICD-10-CM | POA: Diagnosis not present

## 2017-10-19 MED ORDER — HYDROCODONE-ACETAMINOPHEN 5-325 MG PO TABS: 1.0000 | ORAL_TABLET | Freq: Four times a day (QID) | ORAL | 0 refills | Status: DC | PRN

## 2017-10-19 NOTE — Patient Instructions (Addendum)
     If you have lab work done today you will be contacted with your lab results within the next 2 weeks.  If you have not heard from [place] then please contact us. The fastest way to get your results is to register for My Chart.   IF you received an x-ray today, you will receive an invoice from [facility]. Please contact [facility] at [phone] with questions or concerns regarding your invoice.   IF you received labwork today, you will receive an invoice from [place]. Please contact [facility] at [phone] with questions or concerns regarding your invoice.   Our billing staff will not be able to assist you with questions regarding bills from these companies.  You will be contacted with the lab results as soon as they are available. The fastest way to get your results is to activate your My Chart account. Instructions are located on the last page of this paperwork. If you have not heard from [place] regarding the results in 2 weeks, please contact this office.      Warts Warts are small growths on the skin. They are common, and they are caused by a type of germ (virus). Warts can occur on many areas of the body. A person may have one wart or more than one wart. Warts can spread if you scratch a wart and then scratch normal skin. Most warts will go away over many months to a couple years. Treatments may be done if needed. Follow these instructions at home:  Apply over-the-counter and prescription medicines only as told by your doctor.  Do not apply over-the-counter wart medicines to your face or genitals before you ask your doctor if it is okay to do that.  Do not scratch or pick at a wart.  Wash your hands after you touch a wart.  Avoid shaving hair that is over a wart.  Keep all follow-up visits as told by your doctor. This is important. Contact a doctor if:  Your warts do not improve after treatment.  You have redness, swelling, or pain at the site of a  wart.  You have bleeding from a wart, and the bleeding does not stop when you put light pressure on the wart.  You have diabetes and you get a wart. This information is not intended to replace advice given to you by your health care provider. Make sure you discuss any questions you have with your health care provider. Document Released: 04/28/2010 Document Revised: 06/03/2015 Document Reviewed: 03/23/2014 Elsevier Interactive Patient Education  [employer].

## 2017-10-19 NOTE — Progress Notes (Signed)
Misty Bauer 32 y.o.   Chief Complaint  Patient presents with  . Hand Pain    pt states she has a wart on the left hand on the middle finger that os very painful     HISTORY OF PRESENT ILLNESS: This is a 32 y.o. female complaining of persistent wart on left middle finger with pain.  Has been using Compound W.  Last year she had cryotherapy.  Inquiring about surgical removal.  HPI   Prior to Admission medications   Medication Sig Start Date End Date Taking? Authorizing Provider  cetirizine (ZYRTEC) 10 MG tablet Take 10 mg by mouth daily as needed for allergies.   Yes [provider]  eletriptan (RELPAX) 20 MG tablet TAKE 1 TAB BY MOUTH AS NEEDED FOR MIGRAINE. MAY REPEAT IN 2 HRS IF PERSISTS. DO NOT TAKE OVER 3X/WK 04/20/17  Yes Van Clines, MD  meclizine (ANTIVERT) 32 MG tablet Take 1 tablet (32 mg total) by mouth 3 (three) times daily as needed. 11/06/16  Yes Barnett Abu, [place] D, PA-C  nortriptyline (PAMELOR) 10 MG capsule Take 4 capsules at bedtime. 04/20/17  Yes Van Clines, MD  Prenatal Vit-Fe Fumarate-FA (PRENATAL PLUS PO) Take 1 tablet by mouth daily.   Yes [provider]    Allergies  Allergen Reactions  . Amoxicillin Rash  . Coconut Flavor Anaphylaxis, Shortness Of Breath and Swelling  . Penicillins Hives    Has patient had a PCN reaction causing immediate rash, facial/tongue/throat swelling, SOB or lightheadedness with hypotension:yes Has patient had a PCN reaction causing severe rash involving mucus membranes or skin necrosis: no Has patient had a PCN reaction that required hospitalization no Has patient had a PCN reaction occurring within the last 10 years:yes If all of the above answers are "NO", then may proceed with Cephalosporin use.     Patient Active Problem List   Diagnosis Date Noted  . Cutaneous wart 08/19/2015  . Migraine with aura and without status migrainosus, not intractable 10/09/2014  . Worsening headaches 10/09/2014     Past Medical History:  Diagnosis Date  . Allergy   . Anxiety   . 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 Depression   . GERD (gastroesophageal reflux disease)    occasional - diet controlled - tums prn  . History of kidney stones    passes stones, no surgery required  . ITP (idiopathic thrombocytopenic purpura)    In remission since 2008  . Migraine    last one 02/2016 - weather / smells are triggers    Past Surgical History:  Procedure Laterality Date  . DILATION AND EVACUATION N/A 05/03/2016   Procedure: DILATATION AND EVACUATION WITH  ULTRASOUND GUIDANCE ;  Surgeon: Olivia Mackie, MD;  Location: [facility];  Service: Gynecology;  Laterality: N/A;  NEEDS ULTRASOUND   . WISDOM TOOTH EXTRACTION  2014    Social History   Socioeconomic History  . Marital status: Single    Spouse name: Not on file  . Number of children: Not on file  . Years of education: Not on file  . Highest education level: Not on file  Occupational History  . Occupation: Physicist, medical  . Financial resource strain: Not on file  . Food insecurity:    Worry: Not on file    Inability: Not on file  . Transportation needs:    Medical: Not on file    Non-medical: Not on file  Tobacco  Use  . Smoking status: Current Every Day Smoker    Packs/day: 1.00    Years: 14.00    Pack years: 14.00    Types: Cigarettes  . Smokeless tobacco: Never Used  Substance and Sexual Activity  . Alcohol use: Yes    Alcohol/week: 0.0 standard drinks    Comment: Beer/Liquor 4 drinks  . Drug use: No  . Sexual activity: Yes    Partners: Male    Comment: MAB 02/2016 - still has heavy bleeding  Lifestyle  . Physical activity:    Days per week: Not on file    Minutes per session: Not on file  . Stress: Not on file  Relationships  . Social connections:    Talks on phone: Not on file    Gets together: Not on file    Attends religious service: Not on file    Active member of club or  organization: Not on file    Attends meetings of clubs or organizations: Not on file    Relationship status: Not on file  . Intimate partner violence:    Fear of current or ex partner: Not on file    Emotionally abused: Not on file    Physically abused: Not on file    Forced sexual activity: Not on file  Other Topics Concern  . Not on file  Social History Narrative  . Not on file    Family History  Problem Relation Age of Onset  . Thyroid disease Mother   . Hyperlipidemia Maternal Grandmother      Review of Systems  Constitutional: Negative.  Negative for chills and fever.  All other systems reviewed and are negative.  Vitals:   10/19/17 1217  BP: 130/82  Pulse: 79  Resp: 16  Temp: 98 F (36.7 C)  SpO2: 100%     Physical Exam  Constitutional: She is oriented to person, place, and time. She appears well-developed and well-nourished.  HENT:  Head: Normocephalic and atraumatic.  Eyes: Pupils are equal, round, and reactive to light. EOM are normal.  Neck: Normal range of motion.  Cardiovascular: Normal rate.  Pulmonary/Chest: Effort normal.  Musculoskeletal: Normal range of motion.  Neurological: She is alert and oriented to person, place, and time.  Skin: Skin is warm and dry. Capillary refill takes less than 2 seconds.  Psychiatric: She has a normal mood and affect. Her behavior is normal.  Vitals reviewed.      ASSESSMENT & PLAN: Misty Bauer was seen today for hand pain.  Diagnoses and all orders for this visit:  Common wart -     Ambulatory referral to Dermatology  Pain of finger of left hand  Other orders -     Discontinue: HYDROcodone-acetaminophen (NORCO) 5-325 MG tablet; Take 1 tablet by mouth every 6 (six) hours as needed for moderate pain. -     HYDROcodone-acetaminophen (NORCO) 5-325 MG tablet; Take 1 tablet by mouth every 6 (six) hours as needed for moderate pain.    Patient Instructions       If you have lab work done today you will be  contacted with your lab results within the next 2 weeks.  If you have not heard from [place] then please contact us. The fastest way to get your results is to register for My Chart.   IF you received an x-ray today, you will receive an invoice from [facility]. Please contact [facility] at [phone] with questions or concerns regarding your invoice.   IF you received  labwork today, you will receive an invoice from [employer]. Please contact [facility] at [phone] with questions or concerns regarding your invoice.   Our billing staff will not be able to assist you with questions regarding bills from these companies.  You will be contacted with the lab results as soon as they are available. The fastest way to get your results is to activate your My Chart account. Instructions are located on the last page of this paperwork. If you have not heard from [place] regarding the results in 2 weeks, please contact this office.      Warts Warts are small growths on the skin. They are common, and they are caused by a type of germ (virus). Warts can occur on many areas of the body. A person may have one wart or more than one wart. Warts can spread if you scratch a wart and then scratch normal skin. Most warts will go away over many months to a couple years. Treatments may be done if needed. Follow these instructions at home:  Apply over-the-counter and prescription medicines only as told by your doctor.  Do not apply over-the-counter wart medicines to your face or genitals before you ask your doctor if it is okay to do that.  Do not scratch or pick at a wart.  Wash your hands after you touch a wart.  Avoid shaving hair that is over a wart.  Keep all follow-up visits as told by your doctor. This is important. Contact a doctor if:  Your warts do not improve after treatment.  You have redness, swelling, or pain at the site of a wart.  You have bleeding from a wart, and the bleeding does  not stop when you put light pressure on the wart.  You have diabetes and you get a wart. This information is not intended to replace advice given to you by your health care provider. Make sure you discuss any questions you have with your health care provider. Document Released: 04/28/2010 Document Revised: 06/03/2015 Document Reviewed: 03/23/2014 Elsevier Interactive Patient Education  2018 [employer].      Edwina Barth, MD [facility]

## 2017-11-22 ENCOUNTER — Encounter: Payer: Self-pay | Admitting: Emergency Medicine

## 2017-11-22 ENCOUNTER — Other Ambulatory Visit: Payer: Self-pay

## 2017-11-22 ENCOUNTER — Ambulatory Visit (INDEPENDENT_AMBULATORY_CARE_PROVIDER_SITE_OTHER): Payer: 59 | Admitting: Emergency Medicine

## 2017-11-22 VITALS — BP 133/82 | HR 91 | Temp 98.4°F | Resp 16 | Ht 62.0 in | Wt 104.2 lb

## 2017-11-22 DIAGNOSIS — G43809 Other migraine, not intractable, without status migrainosus: Secondary | ICD-10-CM

## 2017-11-22 DIAGNOSIS — R112 Nausea with vomiting, unspecified: Secondary | ICD-10-CM | POA: Diagnosis not present

## 2017-11-22 DIAGNOSIS — J0100 Acute maxillary sinusitis, unspecified: Secondary | ICD-10-CM

## 2017-11-22 MED ORDER — HYDROCODONE-ACETAMINOPHEN 5-325 MG PO TABS: 1.0000 | ORAL_TABLET | Freq: Four times a day (QID) | ORAL | 0 refills | Status: DC | PRN

## 2017-11-22 MED ORDER — ONDANSETRON HCL 4 MG PO TABS: 4.0000 mg | ORAL_TABLET | Freq: Three times a day (TID) | ORAL | 0 refills | Status: DC | PRN

## 2017-11-22 MED ORDER — AZITHROMYCIN 250 MG PO TABS: ORAL_TABLET | ORAL | 0 refills | Status: DC

## 2017-11-22 NOTE — Progress Notes (Signed)
Misty Bauer 32 y.o.   Chief Complaint  Patient presents with  . Sinus Problem    per patient causing migraine to flare up started 11/22/2017  . Nausea    with vomiting x 8 times    HISTORY OF PRESENT ILLNESS: This is a 32 y.o. female complaining of sinus infection triggering migraine headache.  Has been sick for 2 weeks but last night she developed migraine headache with nausea and vomiting.  Took migraine medication and was able to go back to sleep.  Went to work today.  Feels a little better but still has a headache and is still a little nauseous.  Complains of nasal purulent discharge and occasional cough.  Denies fever or chills.  No other significant symptoms.  HPI   Prior to Admission medications   Medication Sig Start Date End Date Taking? Authorizing Provider  eletriptan (RELPAX) 20 MG tablet TAKE 1 TAB BY MOUTH AS NEEDED FOR MIGRAINE. MAY REPEAT IN 2 HRS IF PERSISTS. DO NOT TAKE OVER 3X/WK 04/20/17  Yes Van Clines, MD  meclizine (ANTIVERT) 32 MG tablet Take 1 tablet (32 mg total) by mouth 3 (three) times daily as needed. 11/06/16  Yes Barnett Abu, [place] D, PA-C  nortriptyline (PAMELOR) 10 MG capsule Take 4 capsules at bedtime. 04/20/17  Yes Van Clines, MD  cetirizine (ZYRTEC) 10 MG tablet Take 10 mg by mouth daily as needed for allergies.    [provider]  HYDROcodone-acetaminophen (NORCO) 5-325 MG tablet Take 1 tablet by mouth every 6 (six) hours as needed for moderate pain. Patient not taking: Reported on 11/22/2017 10/19/17   Georgina Quint, MD  Prenatal Vit-Fe Fumarate-FA (PRENATAL PLUS PO) Take 1 tablet by mouth daily.    [provider]    Allergies  Allergen Reactions  . Amoxicillin Rash  . Coconut Flavor Anaphylaxis, Shortness Of Breath and Swelling  . Penicillins Hives    Has patient had a PCN reaction causing immediate rash, facial/tongue/throat swelling, SOB or lightheadedness with hypotension:yes Has patient had a PCN  reaction causing severe rash involving mucus membranes or skin necrosis: no Has patient had a PCN reaction that required hospitalization no Has patient had a PCN reaction occurring within the last 10 years:yes If all of the above answers are "NO", then may proceed with Cephalosporin use.     Patient Active Problem List   Diagnosis Date Noted  . Pain of finger of left hand 10/19/2017  . Common wart 08/19/2015  . Migraine with aura and without status migrainosus, not intractable 10/09/2014  . Worsening headaches 10/09/2014    Past Medical History:  Diagnosis Date  . Allergy   . Anxiety   . 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 Depression   . GERD (gastroesophageal reflux disease)    occasional - diet controlled - tums prn  . History of kidney stones    passes stones, no surgery required  . ITP (idiopathic thrombocytopenic purpura)    In remission since 2008  . Migraine    last one 02/2016 - weather / smells are triggers    Past Surgical History:  Procedure Laterality Date  . DILATION AND EVACUATION N/A 05/03/2016   Procedure: DILATATION AND EVACUATION WITH  ULTRASOUND GUIDANCE ;  Surgeon: Olivia Mackie, MD;  Location: [facility];  Service: Gynecology;  Laterality: N/A;  NEEDS ULTRASOUND   . WISDOM TOOTH EXTRACTION  2014    Social History   Socioeconomic History  .  Marital status: Single    Spouse name: Not on file  . Number of children: Not on file  . Years of education: Not on file  . Highest education level: Not on file  Occupational History  . Occupation: Physicist, medical  . Financial resource strain: Not on file  . Food insecurity:    Worry: Not on file    Inability: Not on file  . Transportation needs:    Medical: Not on file    Non-medical: Not on file  Tobacco Use  . Smoking status: Current Every Day Smoker    Packs/day: 1.00    Years: 14.00    Pack years: 14.00    Types: Cigarettes  . Smokeless tobacco: Never Used   Substance and Sexual Activity  . Alcohol use: Yes    Alcohol/week: 0.0 standard drinks    Comment: Beer/Liquor 4 drinks  . Drug use: No  . Sexual activity: Yes    Partners: Male    Comment: MAB 02/2016 - still has heavy bleeding  Lifestyle  . Physical activity:    Days per week: Not on file    Minutes per session: Not on file  . Stress: Not on file  Relationships  . Social connections:    Talks on phone: Not on file    Gets together: Not on file    Attends religious service: Not on file    Active member of club or organization: Not on file    Attends meetings of clubs or organizations: Not on file    Relationship status: Not on file  . Intimate partner violence:    Fear of current or ex partner: Not on file    Emotionally abused: Not on file    Physically abused: Not on file    Forced sexual activity: Not on file  Other Topics Concern  . Not on file  Social History Narrative  . Not on file    Family History  Problem Relation Age of Onset  . Thyroid disease Mother   . Hyperlipidemia Maternal Grandmother      Review of Systems  Constitutional: Negative.  Negative for chills and fever.  HENT: Positive for congestion and sinus pain.   Eyes: Negative.  Negative for blurred vision and double vision.  Respiratory: Negative.  Negative for cough and shortness of breath.   Cardiovascular: Negative.  Negative for chest pain and palpitations.  Gastrointestinal: Positive for nausea and vomiting.  Genitourinary: Negative.  Negative for dysuria.  Musculoskeletal: Negative.  Negative for back pain, myalgias and neck pain.  Skin: Negative.  Negative for rash.  Neurological: Positive for headaches. Negative for dizziness.  All other systems reviewed and are negative.   Vitals:   11/22/17 1407  BP: 133/82  Pulse: 91  Resp: 16  Temp: 98.4 F (36.9 C)  SpO2: 98%    Physical Exam  Constitutional: She is oriented to person, place, and time. She appears well-developed and  well-nourished.  HENT:  Head: Normocephalic and atraumatic.  Right Ear: External ear normal.  Left Ear: External ear normal.  Nose: Mucosal edema present. Right sinus exhibits maxillary sinus tenderness and frontal sinus tenderness. Left sinus exhibits maxillary sinus tenderness and frontal sinus tenderness.  Mouth/Throat: Oropharynx is clear and moist.  Eyes: Pupils are equal, round, and reactive to light. Conjunctivae and EOM are normal.  Neck: Normal range of motion. Neck supple.  Cardiovascular: Normal rate, regular rhythm and normal heart sounds.  Pulmonary/Chest: Effort normal and breath sounds  normal.  Musculoskeletal: Normal range of motion.  Neurological: She is alert and oriented to person, place, and time. No sensory deficit. She exhibits normal muscle tone. Coordination normal.  Skin: Skin is warm and dry. Capillary refill takes less than 2 seconds.  Psychiatric: She has a normal mood and affect. Her behavior is normal.  Vitals reviewed.   A total of 25 minutes was spent in the room with the patient, greater than 50% of which was in counseling/coordination of care regarding differential diagnosis, treatment, medications, and need for follow-up if no better or worse.  ASSESSMENT & PLAN: Misty Bauer was seen today for sinus problem and nausea.  Diagnoses and all orders for this visit:  Other migraine without status migrainosus, not intractable -     HYDROcodone-acetaminophen (NORCO) 5-325 MG tablet; Take 1 tablet by mouth every 6 (six) hours as needed for moderate pain.  Non-intractable vomiting with nausea, unspecified vomiting type -     ondansetron (ZOFRAN) 4 MG tablet; Take 1 tablet (4 mg total) by mouth every 8 (eight) hours as needed for nausea or vomiting.  Acute non-recurrent maxillary sinusitis -     azithromycin (ZITHROMAX) 250 MG tablet; Sig as indicated    Patient Instructions       If you have lab work done today you will be contacted with your lab  results within the next 2 weeks.  If you have not heard from us then please contact us. The fastest way to get your results is to register for My Chart.   IF you received an x-ray today, you will receive an invoice from [facility]. Please contact [facility] at [phone] with questions or concerns regarding your invoice.   IF you received labwork today, you will receive an invoice from [facility]. Please contact [facility] at [phone] with questions or concerns regarding your invoice.   Our billing staff will not be able to assist you with questions regarding bills from these companies.  You will be contacted with the lab results as soon as they are available. The fastest way to get your results is to activate your My Chart account. Instructions are located on the last page of this paperwork. If you have not heard from us regarding the results in 2 weeks, please contact this office.     Migraine Headache A migraine headache is a very strong throbbing pain on one side or both sides of your head. Migraines can also cause other symptoms. Talk with your doctor about what things may bring on (trigger) your migraine headaches. Follow these instructions at home: Medicines  Take over-the-counter and prescription medicines only as told by your doctor.  Do not drive or use heavy machinery while taking prescription pain medicine.  To prevent or treat constipation while you are taking prescription pain medicine, your doctor may recommend that you: ? Drink enough fluid to keep your pee (urine) clear or pale yellow. ? Take over-the-counter or prescription medicines. ? Eat foods that are high in fiber. These include fresh fruits and vegetables, whole grains, and beans. ? Limit foods that are high in fat and processed sugars. These include fried and sweet foods. Lifestyle  Avoid alcohol.  Do not use any products that contain nicotine or tobacco, such as cigarettes and  e-cigarettes. If you need help quitting, ask your doctor.  Get at least 8 hours of sleep every night.  Limit your stress. General instructions   Keep a journal to find out what may bring on your migraines. For  example, write down: ? What you eat and drink. ? How much sleep you get. ? Any change in what you eat or drink. ? Any change in your medicines.  If you have a migraine: ? Avoid things that make your symptoms worse, such as bright lights. ? It may help to lie down in a dark, quiet room. ? Do not drive or use heavy machinery. ? Ask your doctor what activities are safe for you.  Keep all follow-up visits as told by your doctor. This is important. Contact a doctor if:  You get a migraine that is different or worse than your usual migraines. Get help right away if:  Your migraine gets very bad.  You have a fever.  You have a stiff neck.  You have trouble seeing.  Your muscles feel weak or like you cannot control them.  You start to lose your balance a lot.  You start to have trouble walking.  You pass out (faint). This information is not intended to replace advice given to you by your health care provider. Make sure you discuss any questions you have with your health care provider. Document Released: 10/05/2007 Document Revised: 07/16/2015 Document Reviewed: 06/14/2015 Elsevier Interactive Patient Education  2018 [employer].  Sinusitis, Adult Sinusitis is soreness and inflammation of your sinuses. Sinuses are hollow spaces in the bones around your face. They are located:  Around your eyes.  In the middle of your forehead.  Behind your nose.  In your cheekbones.  Your sinuses and nasal passages are lined with a stringy fluid (mucus). Mucus normally drains out of your sinuses. When your nasal tissues get inflamed or swollen, the mucus can get trapped or blocked so air cannot flow through your sinuses. This lets bacteria, viruses, and funguses grow, and that  leads to infection. Follow these instructions at home: Medicines  Take, use, or apply over-the-counter and prescription medicines only as told by your doctor. These may include nasal sprays.  If you were prescribed an antibiotic medicine, take it as told by your doctor. Do not stop taking the antibiotic even if you start to feel better. Hydrate and Humidify  Drink enough water to keep your pee (urine) clear or pale yellow.  Use a cool mist humidifier to keep the humidity level in your home above 50%.  Breathe in steam for 10-15 minutes, 3-4 times a day or as told by your doctor. You can do this in the bathroom while a hot shower is running.  Try not to spend time in cool or dry air. Rest  Rest as much as possible.  Sleep with your head raised (elevated).  Make sure to get enough sleep each night. General instructions  Put a warm, moist washcloth on your face 3-4 times a day or as told by your doctor. This will help with discomfort.  Wash your hands often with soap and water. If there is no soap and water, use hand sanitizer.  Do not smoke. Avoid being around people who are smoking (secondhand smoke).  Keep all follow-up visits as told by your doctor. This is important. Contact a doctor if:  You have a fever.  Your symptoms get worse.  Your symptoms do not get better within 10 days. Get help right away if:  You have a very bad headache.  You cannot stop throwing up (vomiting).  You have pain or swelling around your face or eyes.  You have trouble seeing.  You feel confused.  Your neck is  stiff.  You have trouble breathing. This information is not intended to replace advice given to you by your health care provider. Make sure you discuss any questions you have with your health care provider. Document Released: 06/14/2007 Document Revised: 08/22/2015 Document Reviewed: 10/21/2014 Elsevier Interactive Patient Education  2018 Elsevier Inc.      Edwina Barth, MD [facility]

## 2017-11-22 NOTE — Patient Instructions (Addendum)
If you have lab work done today you will be contacted with your lab results within the next 2 weeks.  If you have not heard from [place] then please contact us. The fastest way to get your results is to register for My Chart.   IF you received an x-ray today, you will receive an invoice from [facility]. Please contact [facility] at [phone] with questions or concerns regarding your invoice.   IF you received labwork today, you will receive an invoice from [facility]. Please contact [facility] at [phone] with questions or concerns regarding your invoice.   Our billing staff will not be able to assist you with questions regarding bills from these companies.  You will be contacted with the lab results as soon as they are available. The fastest way to get your results is to activate your My Chart account. Instructions are located on the last page of this paperwork. If you have not heard from [place] regarding the results in 2 weeks, please contact this office.     Migraine Headache A migraine headache is a very strong throbbing pain on one side or both sides of your head. Migraines can also cause other symptoms. Talk with your doctor about what things may bring on (trigger) your migraine headaches. Follow these instructions at home: Medicines  Take over-the-counter and prescription medicines only as told by your doctor.  Do not drive or use heavy machinery while taking prescription pain medicine.  To prevent or treat constipation while you are taking prescription pain medicine, your doctor may recommend that you: ? Drink enough fluid to keep your pee (urine) clear or pale yellow. ? Take over-the-counter or prescription medicines. ? Eat foods that are high in fiber. These include fresh fruits and vegetables, whole grains, and beans. ? Limit foods that are high in fat and processed sugars. These include fried and sweet foods. Lifestyle  Avoid alcohol.  Do not use any  products that contain nicotine or tobacco, such as cigarettes and e-cigarettes. If you need help quitting, ask your doctor.  Get at least 8 hours of sleep every night.  Limit your stress. General instructions   Keep a journal to find out what may bring on your migraines. For example, write down: ? What you eat and drink. ? How much sleep you get. ? Any change in what you eat or drink. ? Any change in your medicines.  If you have a migraine: ? Avoid things that make your symptoms worse, such as bright lights. ? It may help to lie down in a dark, quiet room. ? Do not drive or use heavy machinery. ? Ask your doctor what activities are safe for you.  Keep all follow-up visits as told by your doctor. This is important. Contact a doctor if:  You get a migraine that is different or worse than your usual migraines. Get help right away if:  Your migraine gets very bad.  You have a fever.  You have a stiff neck.  You have trouble seeing.  Your muscles feel weak or like you cannot control them.  You start to lose your balance a lot.  You start to have trouble walking.  You pass out (faint). This information is not intended to replace advice given to you by your health care provider. Make sure you discuss any questions you have with your health care provider. Document Released: 10/05/2007 Document Revised: 07/16/2015 Document Reviewed: 06/14/2015 Elsevier Interactive Patient Education  2018 [employer].  Sinusitis, Adult Sinusitis is soreness and inflammation of your sinuses. Sinuses are hollow spaces in the bones around your face. They are located:  Around your eyes.  In the middle of your forehead.  Behind your nose.  In your cheekbones.  Your sinuses and nasal passages are lined with a stringy fluid (mucus). Mucus normally drains out of your sinuses. When your nasal tissues get inflamed or swollen, the mucus can get trapped or blocked so air cannot flow through your  sinuses. This lets bacteria, viruses, and funguses grow, and that leads to infection. Follow these instructions at home: Medicines  Take, use, or apply over-the-counter and prescription medicines only as told by your doctor. These may include nasal sprays.  If you were prescribed an antibiotic medicine, take it as told by your doctor. Do not stop taking the antibiotic even if you start to feel better. Hydrate and Humidify  Drink enough water to keep your pee (urine) clear or pale yellow.  Use a cool mist humidifier to keep the humidity level in your home above 50%.  Breathe in steam for 10-15 minutes, 3-4 times a day or as told by your doctor. You can do this in the bathroom while a hot shower is running.  Try not to spend time in cool or dry air. Rest  Rest as much as possible.  Sleep with your head raised (elevated).  Make sure to get enough sleep each night. General instructions  Put a warm, moist washcloth on your face 3-4 times a day or as told by your doctor. This will help with discomfort.  Wash your hands often with soap and water. If there is no soap and water, use hand sanitizer.  Do not smoke. Avoid being around people who are smoking (secondhand smoke).  Keep all follow-up visits as told by your doctor. This is important. Contact a doctor if:  You have a fever.  Your symptoms get worse.  Your symptoms do not get better within 10 days. Get help right away if:  You have a very bad headache.  You cannot stop throwing up (vomiting).  You have pain or swelling around your face or eyes.  You have trouble seeing.  You feel confused.  Your neck is stiff.  You have trouble breathing. This information is not intended to replace advice given to you by your health care provider. Make sure you discuss any questions you have with your health care provider. Document Released: 06/14/2007 Document Revised: 08/22/2015 Document Reviewed: 10/21/2014 Elsevier  Interactive Patient Education  [employer]2018 Elsevier Inc.

## 2018-04-22 ENCOUNTER — Ambulatory Visit: Payer: 59 | Admitting: Neurology

## 2018-04-23 ENCOUNTER — Telehealth (INDEPENDENT_AMBULATORY_CARE_PROVIDER_SITE_OTHER): Payer: 59 | Admitting: Neurology

## 2018-04-23 ENCOUNTER — Other Ambulatory Visit: Payer: Self-pay

## 2018-04-23 DIAGNOSIS — G43109 Migraine with aura, not intractable, without status migrainosus: Secondary | ICD-10-CM

## 2018-04-23 MED ORDER — NORTRIPTYLINE HCL 10 MG PO CAPS: ORAL_CAPSULE | ORAL | 3 refills | Status: DC

## 2018-04-23 MED ORDER — ELETRIPTAN HYDROBROMIDE 20 MG PO TABS: ORAL_TABLET | ORAL | 11 refills | Status: DC

## 2018-04-23 NOTE — Progress Notes (Signed)
Virtual Visit via Video Note The purpose of this virtual visit is to provide medical care while limiting exposure to the novel coronavirus.    Consent was obtained for video visit:  Yes.   Answered questions that patient had about telehealth interaction:  Yes.   I discussed the limitations, risks, security and privacy concerns of performing an evaluation and management service by telemedicine. I also discussed with the patient that there may be a patient responsible charge related to this service. The patient expressed understanding and agreed to proceed.  Pt location: Home Physician Location: office Name of referring provider:  McVey, Garner Gavel* I connected with Nisaa Sucher at patients initiation/request on 04/23/2018 at 10:30 AM EDT by video enabled telemedicine application and verified that I am speaking with the correct person using two identifiers. Pt MRN:  [id number] Pt DOB:  02/17/1985 Video Participants:  Duwayne Heck Oxendine   History of Present Illness:  The patient was last seen in April 2019 for episodic migraine with aura. Since her last visit, she continues to do well on nortriptyline 40mg  qhs and prn Relpax. She reports that migraines are a lot better, she was doing well until last month which she attributes to the pollen. She had a migraine that lasted for 4 days with nausea and vomiting. She has good response to prn Relpax. She denies any dizziness, vision changes, neck pain. She had a fall in January and still has problems with her right elbow, she has difficulty with weight-bearing and muscle spasms in the right arm and shoulder. If she puts weight the wrong way on her elbow, there is some numbness. Sleep is hit or miss, no daytime drowsiness. They are still hoping for pregnancy.  HPI: This is a pleasant 33 yo LH woman with a strong family history of migraines (mother, 2 maternal aunts, 2 sisters) and personal history of migraines since age 27 or 42, who presented for  headache worsening over the past 4-5 years. She reports that since 2010 or 2011, she has been having a constant daily low grade 1 to 2 over 10 headache. She has migraines 1-2 times a month, starting at the base of her neck, then radiating up diffusely, with pressure-like 10/10 pain with associated nausea, photo and phonophobia, as well as significant sensitivity to smells. She usually has visual changes prior to migraines, such as seeing spots, or her vision briefly blackening then returning to normal. There is occasional associated vomiting. She took Imitrex and Fioricet in the past, which did not help. She has been taking Excedrin on a near daily basis (around 6 or 7 doses a week) for the past couple of years. This has helped take the edge off. Poor sleep is also a trigger, she usually gets 5-6 hours of sleep on average. She denies taking any headache preventative medication in the past. She stopped birth control and has had only 2 menstrual periods, both times seemed to have had more migraines. They are trying to get pregnant.     Observations/Objective:  Patient is awake, alert, oriented x 3. No aphasia or dysarthria. Intact fluency and comprehension. Remote and recent memory intact. Able to name and repeat. Cranial nerves: pupils equal, round. Extraocular movements intact with no nystagmus. No facial asymmetry. Motor: moves all extremities symmetrically. No incoordination on finger to nose testing. Gait: narrow-based and steady, able to tandem walk adequately. Negative Romberg test.  Assessment and Plan:   This is a pleasant 33 yo LH woman with a history of  migraines since childhood, who presented in 2016 with daily headaches and migraine exacerbations occurring 1-2 times a month. She had good response to nortriptyline, no further daily headaches, migraines better controlled. Refills for nortriptyline 40mg  qhs and prn Relpax sent. She is hoping for pregnancy, we discussed the possibility of reducing  nortriptyline, including pregnancy class C with this medication. She will follow-up in 1 year and knows to call for any changes.   Follow Up Instructions:   I discussed the assessment and treatment plan with the patient. The patient was provided an opportunity to ask questions and all were answered. The patient agreed with the plan and demonstrated an understanding of the instructions.   The patient was advised to call back or seek an in-person evaluation if the symptoms worsen or if the condition fails to improve as anticipated.    Total Time spent in visit with the patient was 20 minutes, of which more than 50% of the time was spent in counseling and/or coordinating care on the above.   Pt understands and agrees with the plan of care outlined.     Van ClinesKaren M Kajol Crispen, MD

## 2018-09-03 ENCOUNTER — Other Ambulatory Visit: Payer: Self-pay

## 2018-09-03 ENCOUNTER — Telehealth: Payer: Self-pay | Admitting: Neurology

## 2018-09-03 DIAGNOSIS — F17210 Nicotine dependence, cigarettes, uncomplicated: Secondary | ICD-10-CM | POA: Insufficient documentation

## 2018-09-03 DIAGNOSIS — G43909 Migraine, unspecified, not intractable, without status migrainosus: Secondary | ICD-10-CM | POA: Diagnosis present

## 2018-09-03 DIAGNOSIS — Z79899 Other long term (current) drug therapy: Secondary | ICD-10-CM | POA: Insufficient documentation

## 2018-09-03 NOTE — Telephone Encounter (Signed)
Patient is upset that no one has called her back and that she is going to have to go to the hospital for her migraine, please call her

## 2018-09-03 NOTE — ED Triage Notes (Signed)
Pt in with co migraine x 4 days, took rx meds without relief. Called neurologist this am without response.

## 2018-09-03 NOTE — Telephone Encounter (Signed)
Patient left a VM and wants to see if she can come in for a headache shot she has had a migraine for the last 3 days

## 2018-09-04 ENCOUNTER — Emergency Department
Admission: EM | Admit: 2018-09-04 | Discharge: 2018-09-04 | Disposition: A | Discharge status: Home / Self Care | Payer: 59 | Attending: Emergency Medicine | Admitting: Emergency Medicine

## 2018-09-04 DIAGNOSIS — G43809 Other migraine, not intractable, without status migrainosus: Secondary | ICD-10-CM

## 2018-09-04 MED ORDER — PROCHLORPERAZINE EDISYLATE 10 MG/2ML IJ SOLN
2.5000 mg | Freq: Once | INTRAMUSCULAR | Status: AC
  Administered 2018-09-04: 02:00:00 2.5 mg via INTRAMUSCULAR
  Filled 2018-09-04: qty 2

## 2018-09-04 MED ORDER — DEXTROSE-NACL 5-0.45 % IV SOLN
Freq: Once | INTRAVENOUS | Status: AC
  Administered 2018-09-04: 03:00:00 via INTRAVENOUS

## 2018-09-04 MED ORDER — DIPHENHYDRAMINE HCL 50 MG/ML IJ SOLN
12.5000 mg | Freq: Once | INTRAMUSCULAR | Status: AC
  Administered 2018-09-04: 02:00:00 12.5 mg via INTRAVENOUS
  Filled 2018-09-04: qty 1

## 2018-09-04 MED ORDER — KETOROLAC TROMETHAMINE 30 MG/ML IJ SOLN
10.0000 mg | Freq: Once | INTRAMUSCULAR | Status: AC
  Administered 2018-09-04: 02:00:00 9.9 mg via INTRAVENOUS
  Filled 2018-09-04: qty 1

## 2018-09-04 NOTE — ED Provider Notes (Signed)
[facility] Emergency Department Provider Note   ____________________________________________   First MD Initiated Contact with Patient 09/04/18 0133     (approximate)  I have reviewed the triage vital signs and the nursing notes.   HISTORY  Chief Complaint Migraine    HPI Misty Bauer is a 33 y.o. female who presents to the ED from home with a chief complaint of migraine headache.  Patient has a history of migraine headaches, followed by neurologist.  On nortriptyline and Relpax for migraines.  Developed a typical migraine 3 days ago a day after she was accidentally struck in the head by a piece of plywood.  Denies LOC.  Initially patient had nausea vomiting but that has resolved.  She is upset because she called her neurologist office at 7:30 AM and never received a call back.  She was hoping to go to the office to receive an IM migraine cocktail.  Migraine is right-sided and associated with photophobia.  Denies fever, cough, chest pain, shortness of breath, abdominal pain, dizziness.  Denies recent travel.       Past Medical History:  Diagnosis Date  . Allergy   . Anxiety   . 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 Depression   . GERD (gastroesophageal reflux disease)    occasional - diet controlled - tums prn  . History of kidney stones    passes stones, no surgery required  . ITP (idiopathic thrombocytopenic purpura)    In remission since 2008  . Migraine    last one 02/2016 - weather / smells are triggers    Patient Active Problem List   Diagnosis Date Noted  . Pain of finger of left hand 10/19/2017  . Common wart 08/19/2015  . Migraine with aura and without status migrainosus, not intractable 10/09/2014  . Worsening headaches 10/09/2014    Past Surgical History:  Procedure Laterality Date  . DILATION AND EVACUATION N/A 05/03/2016   Procedure: DILATATION AND EVACUATION WITH  ULTRASOUND GUIDANCE ;  Surgeon:  Olivia Mackieichard Taavon, MD;  Location: [facility];  Service: Gynecology;  Laterality: N/A;  NEEDS ULTRASOUND   . WISDOM TOOTH EXTRACTION  2014    Prior to Admission medications   Medication Sig Start Date End Date Taking? Authorizing Provider  cetirizine (ZYRTEC) 10 MG tablet Take 10 mg by mouth daily as needed for allergies.    [provider]  eletriptan (RELPAX) 20 MG tablet TAKE 1 TAB BY MOUTH AS NEEDED FOR MIGRAINE. MAY REPEAT IN 2 HRS IF PERSISTS. DO NOT TAKE OVER 3X/WK 04/23/18   Van ClinesAquino, Karen M, MD  meclizine (ANTIVERT) 32 MG tablet Take 1 tablet (32 mg total) by mouth 3 (three) times daily as needed. 11/06/16   Benjiman CoreWiseman, Brittany D, PA-C  nortriptyline (PAMELOR) 10 MG capsule Take 4 capsules at bedtime. 04/23/18   Van ClinesAquino, Karen M, MD  Prenatal Vit-Fe Fumarate-FA (PRENATAL PLUS PO) Take 1 tablet by mouth daily.    [provider]    Allergies Amoxicillin, Coconut flavor, and Penicillins  Family History  Problem Relation Age of Onset  . Thyroid disease Mother   . Hyperlipidemia Maternal Grandmother     Social History Social History   Tobacco Use  . Smoking status: Current Every Day Smoker    Packs/day: 1.00    Years: 14.00    Pack years: 14.00    Types: Cigarettes  . Smokeless tobacco: Never Used  Substance Use Topics  . Alcohol use:  Yes    Alcohol/week: 0.0 standard drinks    Comment: Beer/Liquor 4 drinks  . Drug use: No    Review of Systems  Constitutional: No fever/chills Eyes: No visual changes. ENT: No sore throat. Cardiovascular: Denies chest pain. Respiratory: Denies shortness of breath. Gastrointestinal: No abdominal pain.  No nausea, no vomiting.  No diarrhea.  No constipation. Genitourinary: Negative for dysuria. Musculoskeletal: Negative for back pain. Skin: Negative for rash. Neurological: Positive for headache.  Negative for focal weakness or numbness.   ____________________________________________   PHYSICAL EXAM:  VITAL SIGNS: ED  Triage Vitals  Enc Vitals Group     BP 09/03/18 2203 (!) 136/91     Pulse Rate 09/03/18 2203 93     Resp 09/03/18 2203 20     Temp 09/03/18 2203 98.7 F (37.1 C)     Temp Source 09/03/18 2203 Oral     SpO2 09/03/18 2203 100 %     Weight 09/03/18 2204 102 lb (46.3 kg)     Height 09/03/18 2204 5\' 2"  (1.575 m)     Head Circumference --      Peak Flow --      Pain Score 09/03/18 2204 7     Pain Loc --      Pain Edu? --      Excl. in [place]? --     Constitutional: Alert and oriented. Well appearing and in mild acute distress.  Wearing sunglasses. Eyes: Conjunctivae are normal. PERRL. EOMI. Head: Atraumatic. Nose: No congestion/rhinnorhea. Mouth/Throat: Mucous membranes are moist.  Oropharynx non-erythematous. Neck: No stridor.  No carotid bruits.  Supple neck without meningismus. Cardiovascular: Normal rate, regular rhythm. Grossly normal heart sounds.  Good peripheral circulation. Respiratory: Normal respiratory effort.  No retractions. Lungs CTAB. Gastrointestinal: Soft and nontender. No distention. No abdominal bruits. No CVA tenderness. Musculoskeletal: No lower extremity tenderness nor edema.  No joint effusions. Neurologic: Alert and oriented x3.  CN II-XII grossly intact. Normal speech and language. No gross focal neurologic deficits are appreciated. No gait instability. Skin:  Skin is warm, dry and intact. No rash noted.  No petechiae. Psychiatric: Mood and affect are normal. Speech and behavior are normal.  ____________________________________________   LABS (all labs ordered are listed, but only abnormal results are displayed)  Labs Reviewed - No data to display ____________________________________________  EKG  None ____________________________________________  RADIOLOGY  ED MD interpretation: Rachel  Official radiology report(s): No results found.  ____________________________________________   PROCEDURES  Procedure(s) performed (including Critical  Care):  Procedures   ____________________________________________   INITIAL IMPRESSION / ASSESSMENT AND PLAN / ED COURSE  As part of my medical decision making, I reviewed the following data within the [place] notes reviewed and incorporated, Old chart reviewed and Notes from prior ED visits     Anthea Udovich was evaluated in Emergency Department on 09/04/2018 for the symptoms described in the history of present illness. She was evaluated in the context of the global COVID-19 pandemic, which necessitated consideration that the patient might be at risk for infection with the SARS-CoV-2 virus that causes COVID-19. Institutional protocols and algorithms that pertain to the evaluation of patients at risk for COVID-19 are in a state of rapid change based on information released by regulatory bodies including the CDC and federal and state organizations. These policies and algorithms were followed during the patient's care in the ED.   33 year old female with recurrent migraines presenting for migraine x3 days. Differential diagnosis includes, but is not limited  to, intracranial hemorrhage, meningitis/encephalitis, previous head trauma, cavernous venous thrombosis, tension headache, temporal arteritis, migraine or migraine equivalent, idiopathic intracranial hypertension, and non-specific headache.  I did offer CT head since patient was struck by plywood prior to onset of her migraine but patient declines.  I think this is reasonable given she has no focal neurological deficits.  Will initiate IV fluids, migraine cocktail to include IV Toradol, Compazine and Benadryl.  Will reassess.   Clinical Course as of Sep 04 426  Wed Sep 04, 2018  0932 Patient resting in no acute distress.  Reports pain is significantly improved.  Reexamination demonstrates no focal neurological deficits; neck remains supple without meningismus.  Patient has medicines at home and does not require  refills.  Strict return precautions given.  Patient verbalizes understanding agrees with plan of care.   [JS]    Clinical Course User Index [JS] Irean Hong, MD     ____________________________________________   FINAL CLINICAL IMPRESSION(S) / ED DIAGNOSES  Final diagnoses:  Other migraine without status migrainosus, not intractable     ED Discharge Orders    None       Note:  This document was prepared using Dragon voice recognition software and may include unintentional dictation errors.   Irean Hong, MD 09/04/18 [phone]

## 2018-09-04 NOTE — Discharge Instructions (Addendum)
Continue your daily medicines as directed by your doctor. Return to the ER for worsening symptoms, persistent vomiting, difficulty breathing or other concerns.

## 2018-09-04 NOTE — Telephone Encounter (Signed)
Spoke with pt. She did go to the hospital. States that she is feeling much better. Pain is between 1-2.  No need to come in since feeling better.

## 2019-06-19 ENCOUNTER — Ambulatory Visit
Admission: EM | Admit: 2019-06-19 | Discharge: 2019-06-19 | Disposition: A | Discharge status: Home / Self Care | Payer: Self-pay | Attending: Physician Assistant | Admitting: Physician Assistant

## 2019-06-19 DIAGNOSIS — G43109 Migraine with aura, not intractable, without status migrainosus: Secondary | ICD-10-CM

## 2019-06-19 DIAGNOSIS — G43019 Migraine without aura, intractable, without status migrainosus: Secondary | ICD-10-CM

## 2019-06-19 MED ORDER — KETOROLAC TROMETHAMINE 15 MG/ML IJ SOLN
15.0000 mg | Freq: Once | INTRAMUSCULAR | Status: AC
  Administered 2019-06-19: 15 mg via INTRAVENOUS

## 2019-06-19 MED ORDER — ELETRIPTAN HYDROBROMIDE 20 MG PO TABS
ORAL_TABLET | ORAL | 0 refills | Status: DC
Start: 2019-06-19 — End: 2020-08-07

## 2019-06-19 MED ORDER — ONDANSETRON 4 MG PO TBDP
4.0000 mg | ORAL_TABLET | Freq: Three times a day (TID) | ORAL | 0 refills | Status: DC | PRN
Start: 2019-06-19 — End: 2020-08-07

## 2019-06-19 MED ORDER — DEXAMETHASONE SODIUM PHOSPHATE 10 MG/ML IJ SOLN
10.0000 mg | Freq: Once | INTRAMUSCULAR | Status: AC
  Administered 2019-06-19: 10 mg via INTRAVENOUS

## 2019-06-19 MED ORDER — METOCLOPRAMIDE HCL 5 MG/ML IJ SOLN
5.0000 mg | Freq: Once | INTRAMUSCULAR | Status: AC
  Administered 2019-06-19: 5 mg via INTRAVENOUS

## 2019-06-19 MED ORDER — SODIUM CHLORIDE 0.9 % IV BOLUS
1000.0000 mL | Freq: Once | INTRAVENOUS | Status: AC
  Administered 2019-06-19: 1000 mL via INTRAVENOUS

## 2019-06-19 NOTE — ED Triage Notes (Signed)
Pt c/o migraine headache with nausea, vomiting, sensitive to light and sound since yesterday.

## 2019-06-19 NOTE — ED Provider Notes (Signed)
[facility]    CSN: [id number] Arrival date & time: 06/19/19  0803      History   Chief Complaint Chief Complaint  Patient presents with  . Migraine    HPI Noreen Mackintosh is a 34 y.o. female.   34 year old female wit history of migraine, ITP in remission since 2008, allergies comes in for 2 day history of migraine headache. She has associated photophobia, nausea, vomiting, phonophobia. 10+ episodes of NBNB vomiting. Unable to tolerate oral intake. Sinus congestion symptoms that are intermittent for the past few months, for which she feels can trigger her migraine. No fevers. Had lightheadedness, dizziness with spinning sensation. Denies syncope. No head injuries. Feels similar to her normal migraines, though with more neck pain. Tylenol without relief.  Patient was on nortriptyline in the past for maintenance, for which she discontinued 10/2018 due to loss of insurance. First migraine since then. Did not have her eletriptan with her when symptoms started.      Past Medical History:  Diagnosis Date  . Allergy   . Anxiety   . Clotting disorder ([place])    In remission since 2008 - ITP   . Depression   . GERD (gastroesophageal reflux disease)    occasional - diet controlled - tums prn  . History of kidney stones    passes stones, no surgery required  . ITP (idiopathic thrombocytopenic purpura)    In remission since 2008  . Migraine    last one 02/2016 - weather / smells are triggers    Patient Active Problem List   Diagnosis Date Noted  . Pain of finger of left hand 10/19/2017  . Common wart 08/19/2015  . Migraine with aura and without status migrainosus, not intractable 10/09/2014  . Worsening headaches 10/09/2014    Past Surgical History:  Procedure Laterality Date  . DILATION AND EVACUATION N/A 05/03/2016   Procedure: DILATATION AND EVACUATION WITH  ULTRASOUND GUIDANCE ;  Surgeon: Brien Few, MD;  Location: [facility];  Service: Gynecology;  Laterality:  N/A;  NEEDS ULTRASOUND   . WISDOM TOOTH EXTRACTION  2014    OB History   No obstetric history on file.      Home Medications    Prior to Admission medications   Medication Sig Start Date End Date Taking? Authorizing Provider  cetirizine (ZYRTEC) 10 MG tablet Take 10 mg by mouth daily as needed for allergies.    [provider]  eletriptan (RELPAX) 20 MG tablet TAKE 1 TAB BY MOUTH AS NEEDED FOR MIGRAINE. MAY REPEAT IN 2 HRS IF PERSISTS. DO NOT TAKE OVER 3X/WK 06/19/19   Tasia Catchings, Jamontae Thwaites V, PA-C  nortriptyline (PAMELOR) 10 MG capsule Take 4 capsules at bedtime. 04/23/18   Cameron Sprang, MD  ondansetron (ZOFRAN ODT) 4 MG disintegrating tablet Take 1 tablet (4 mg total) by mouth every 8 (eight) hours as needed for nausea or vomiting. 06/19/19   Ok Edwards, PA-C    Family History Family History  Problem Relation Age of Onset  . Thyroid disease Mother   . Hyperlipidemia Maternal Grandmother     Social History Social History   Tobacco Use  . Smoking status: Current Every Day Smoker    Packs/day: 1.00    Years: 14.00    Pack years: 14.00    Types: Cigarettes  . Smokeless tobacco: Never Used  Vaping Use  . Vaping Use: Some days  Substance Use Topics  . Alcohol use: Yes    Alcohol/week: 0.0 standard  drinks    Comment: Beer/Liquor 4 drinks  . Drug use: No     Allergies   Amoxicillin, Coconut flavor, and Penicillins   Review of Systems Review of Systems  Reason unable to perform ROS: See HPI as above.     Physical Exam Triage Vital Signs ED Triage Vitals  Enc Vitals Group     BP 06/19/19 0814 (!) 143/106     Pulse Rate 06/19/19 0814 79     Resp 06/19/19 0814 18     Temp 06/19/19 0814 98.3 F (36.8 C)     Temp Source 06/19/19 0814 Oral     SpO2 06/19/19 0814 97 %     Weight --      Height --      Head Circumference --      Peak Flow --      Pain Score 06/19/19 0815 7     Pain Loc --      Pain Edu? --      Excl. in [place]? --    No data found.  Updated  Vital Signs BP (!) 143/106 (BP Location: Left Arm)   Pulse 79   Temp 98.3 F (36.8 C) (Oral)   Resp 18   LMP 05/10/2019   SpO2 97%   Physical Exam Constitutional:      General: She is not in acute distress.    Appearance: Normal appearance. She is well-developed. She is not toxic-appearing or diaphoretic.  HENT:     Head: Normocephalic and atraumatic.     Mouth/Throat:     Mouth: Mucous membranes are moist.     Pharynx: Oropharynx is clear. Uvula midline.  Eyes:     Conjunctiva/sclera: Conjunctivae normal.     Pupils: Pupils are equal, round, and reactive to light.  Cardiovascular:     Rate and Rhythm: Normal rate and regular rhythm.     Heart sounds: No murmur heard.   Pulmonary:     Effort: Pulmonary effort is normal. No respiratory distress.     Comments: Speaking in full sentences without difficulty. LCTAB Musculoskeletal:     Cervical back: Normal range of motion and neck supple.  Skin:    General: Skin is warm and dry.  Neurological:     Mental Status: She is alert and oriented to person, place, and time.     Comments: Cranial nerves II-XII grossly intact. Strength 5/5 bilaterally for upper and lower extremity. Sensation intact. Normal coordination with normal finger to nose, heel to shin. Negative pronator drift, romberg. Gait intact. Able to ambulate on own without difficulty.        UC Treatments / Results  Labs (all labs ordered are listed, but only abnormal results are displayed) Labs Reviewed - No data to display  EKG   Radiology No results found.  Procedures Procedures (including critical care time)  Medications Ordered in UC Medications  sodium chloride 0.9 % bolus 1,000 mL (0 mLs Intravenous Stopped 06/19/19 0919)  metoCLOPramide (REGLAN) injection 5 mg (5 mg Intravenous Given 06/19/19 0846)  dexamethasone (DECADRON) injection 10 mg (10 mg Intravenous Given 06/19/19 0851)  ketorolac (TORADOL) 15 MG/ML injection 15 mg (15 mg Intravenous Given  06/19/19 0850)    Initial Impression / Assessment and Plan / UC Course  I have reviewed the triage vital signs and the nursing notes.  Pertinent labs & imaging results that were available during my care of the patient were reviewed by me and considered in my medical decision making (see chart  for details).    Given multiple episodes of emesis, will start IV fluids for medications. Decadron, reglan, toradol given. Patient with improvement of symptoms, and able to tolerate oral intake. zofran as needed. Written Rx of eletripan as needed. PCP resources provided. Return precautions given.  Final Clinical Impressions(s) / UC Diagnoses   Final diagnoses:  Intractable migraine without aura and without status migrainosus   ED Prescriptions    Medication Sig Dispense Auth. Provider   ondansetron (ZOFRAN ODT) 4 MG disintegrating tablet Take 1 tablet (4 mg total) by mouth every 8 (eight) hours as needed for nausea or vomiting. 15 tablet Jameire Kouba V, PA-C   eletriptan (RELPAX) 20 MG tablet TAKE 1 TAB BY MOUTH AS NEEDED FOR MIGRAINE. MAY REPEAT IN 2 HRS IF PERSISTS. DO NOT TAKE OVER 3X/WK 6 tablet Belinda Fisher, PA-C     PDMP not reviewed this encounter.   Belinda Fisher, PA-C 06/19/19 1019

## 2019-06-19 NOTE — Discharge Instructions (Signed)
Toradol, reglan, decadron given in office today with IV fluids. I have refilled your eletriptan as needed. Zofran as needed for nausea/vomiting. Keep hydrated, urine should be clear to pale yellow in color. Follow up with PCP for further evaluation and maintenance needed. If having worsening symptoms, passing out, fever, confusion, nausea/vomiting not controlled by medicine, go to the ED for further evaluation needed.

## 2019-09-23 ENCOUNTER — Emergency Department (HOSPITAL_COMMUNITY): Payer: Self-pay

## 2019-09-23 ENCOUNTER — Emergency Department (HOSPITAL_COMMUNITY)
Admission: EM | Admit: 2019-09-23 | Discharge: 2019-09-23 | Disposition: A | Discharge status: Home / Self Care | Payer: Self-pay | Attending: Emergency Medicine | Admitting: Emergency Medicine

## 2019-09-23 ENCOUNTER — Other Ambulatory Visit: Payer: Self-pay

## 2019-09-23 ENCOUNTER — Encounter (HOSPITAL_COMMUNITY): Payer: Self-pay

## 2019-09-23 DIAGNOSIS — R1013 Epigastric pain: Secondary | ICD-10-CM | POA: Insufficient documentation

## 2019-09-23 DIAGNOSIS — F17210 Nicotine dependence, cigarettes, uncomplicated: Secondary | ICD-10-CM | POA: Insufficient documentation

## 2019-09-23 DIAGNOSIS — R079 Chest pain, unspecified: Secondary | ICD-10-CM | POA: Insufficient documentation

## 2019-09-23 DIAGNOSIS — K219 Gastro-esophageal reflux disease without esophagitis: Secondary | ICD-10-CM

## 2019-09-23 LAB — BASIC METABOLIC PANEL
Anion gap: 15 (ref 5–15)
BUN: 13 mg/dL (ref 6–20)
CO2: 25 mmol/L (ref 22–32)
Calcium: 9.6 mg/dL (ref 8.9–10.3)
Chloride: 97 mmol/L — ABNORMAL LOW (ref 98–111)
Creatinine, Ser: 0.57 mg/dL (ref 0.44–1.00)
GFR calc Af Amer: >60 mL/min (ref >60)
GFR calc non Af Amer: >60 mL/min (ref >60)
Glucose, Bld: 108 mg/dL — ABNORMAL HIGH (ref 70–99)
Potassium: 3.6 mmol/L (ref 3.5–5.1)
Sodium: 137 mmol/L (ref 135–145)

## 2019-09-23 LAB — TROPONIN I (HIGH SENSITIVITY): Troponin I (High Sensitivity): <2 ng/L (ref <18)

## 2019-09-23 LAB — CBC
HCT: 40.5 % (ref 36.0–46.0)
Hemoglobin: 13.6 g/dL (ref 12.0–15.0)
MCH: 34 pg (ref 26.0–34.0)
MCHC: 33.6 g/dL (ref 30.0–36.0)
MCV: 101.3 fL — ABNORMAL HIGH (ref 80.0–100.0)
Platelets: 272 10*3/uL (ref 150–400)
RBC: 4 MIL/uL (ref 3.87–5.11)
RDW: 13.2 % (ref 11.5–15.5)
WBC: 7.1 10*3/uL (ref 4.0–10.5)
nRBC: 0 % (ref 0.0–0.2)

## 2019-09-23 LAB — HEPATIC FUNCTION PANEL
ALT: 25 U/L (ref 0–44)
AST: 30 U/L (ref 15–41)
Albumin: 4.4 g/dL (ref 3.5–5.0)
Alkaline Phosphatase: 59 U/L (ref 38–126)
Bilirubin, Direct: 0.1 mg/dL (ref 0.0–0.2)
Indirect Bilirubin: 0.7 mg/dL (ref 0.3–0.9)
Total Bilirubin: 0.8 mg/dL (ref 0.3–1.2)
Total Protein: 7.4 g/dL (ref 6.5–8.1)

## 2019-09-23 LAB — TSH: TSH: 1.278 u[IU]/mL (ref 0.350–4.500)

## 2019-09-23 LAB — I-STAT BETA HCG BLOOD, ED (NOT ORDERABLE): I-stat hCG, quantitative: <5 m[IU]/mL (ref <5)

## 2019-09-23 LAB — LIPASE, BLOOD: Lipase: 47 U/L (ref 11–51)

## 2019-09-23 MED ORDER — LIDOCAINE VISCOUS HCL 2 % MT SOLN
15.0000 mL | Freq: Once | OROMUCOSAL | Status: AC
  Administered 2019-09-23: 15 mL via ORAL
  Filled 2019-09-23: qty 15

## 2019-09-23 MED ORDER — ALUM & MAG HYDROXIDE-SIMETH 200-200-20 MG/5ML PO SUSP
30.0000 mL | Freq: Once | ORAL | Status: AC
  Administered 2019-09-23: 30 mL via ORAL
  Filled 2019-09-23: qty 30

## 2019-09-23 MED ORDER — PANTOPRAZOLE SODIUM 20 MG PO TBEC
20.0000 mg | DELAYED_RELEASE_TABLET | Freq: Two times a day (BID) | ORAL | 1 refills | Status: DC
Start: 2019-09-23 — End: 2020-08-07

## 2019-09-23 MED ORDER — SUCRALFATE 1 G PO TABS
1.0000 g | ORAL_TABLET | Freq: Once | ORAL | Status: AC
  Administered 2019-09-23: 1 g via ORAL
  Filled 2019-09-23: qty 1

## 2019-09-23 MED ORDER — SUCRALFATE 1 G PO TABS
1.0000 g | ORAL_TABLET | Freq: Four times a day (QID) | ORAL | 0 refills | Status: DC
Start: 2019-09-23 — End: 2020-08-07

## 2019-09-23 NOTE — ED Provider Notes (Signed)
[facility]-EMERGENCY DEPT Provider Note   CSN: [id number] Arrival date & time: 09/23/19  5638     History Chief Complaint  Patient presents with  . Chest Pain    Misty Bauer is a 34 y.o. female.  34 year old female presents with epigastric pain rating up to her chest which again this morning when she woke up.  Pain was not exertional and not associated with diaphoresis or nausea or vomiting.  Does have history of acid reflux and took her Protonix without relief.  No recent fever or chills.        Past Medical History:  Diagnosis Date  . Allergy   . Anxiety   . 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 Depression   . GERD (gastroesophageal reflux disease)    occasional - diet controlled - tums prn  . History of kidney stones    passes stones, no surgery required  . ITP (idiopathic thrombocytopenic purpura)    In remission since 2008  . Migraine    last one 02/2016 - weather / smells are triggers    Patient Active Problem List   Diagnosis Date Noted  . Pain of finger of left hand 10/19/2017  . Common wart 08/19/2015  . Migraine with aura and without status migrainosus, not intractable 10/09/2014  . Worsening headaches 10/09/2014    Past Surgical History:  Procedure Laterality Date  . DILATION AND EVACUATION N/A 05/03/2016   Procedure: DILATATION AND EVACUATION WITH  ULTRASOUND GUIDANCE ;  Surgeon: Olivia Mackie, MD;  Location: [facility];  Service: Gynecology;  Laterality: N/A;  NEEDS ULTRASOUND   . WISDOM TOOTH EXTRACTION  2014     OB History   No obstetric history on file.     Family History  Problem Relation Age of Onset  . Thyroid disease Mother   . Hyperlipidemia Maternal Grandmother     Social History   Tobacco Use  . Smoking status: Current Every Day Smoker    Packs/day: 1.00    Years: 14.00    Pack years: 14.00    Types: Cigarettes  . Smokeless tobacco: Never Used  Vaping Use  . Vaping Use: Some  days  Substance Use Topics  . Alcohol use: Yes    Alcohol/week: 0.0 standard drinks    Comment: Beer/Liquor 4 drinks  . Drug use: No    Home Medications Prior to Admission medications   Medication Sig Start Date End Date Taking? Authorizing Provider  cetirizine (ZYRTEC) 10 MG tablet Take 10 mg by mouth daily as needed for allergies.    [provider]  eletriptan (RELPAX) 20 MG tablet TAKE 1 TAB BY MOUTH AS NEEDED FOR MIGRAINE. MAY REPEAT IN 2 HRS IF PERSISTS. DO NOT TAKE OVER 3X/WK 06/19/19   Cathie Hoops, Amy V, PA-C  nortriptyline (PAMELOR) 10 MG capsule Take 4 capsules at bedtime. 04/23/18   Van Clines, MD  ondansetron (ZOFRAN ODT) 4 MG disintegrating tablet Take 1 tablet (4 mg total) by mouth every 8 (eight) hours as needed for nausea or vomiting. 06/19/19   Cathie Hoops, Amy V, PA-C    Allergies    Amoxicillin, Coconut flavor, and Penicillins  Review of Systems   Review of Systems  All other systems reviewed and are negative.   Physical Exam Updated Vital Signs BP (!) 132/95 (BP Location: Left Arm)   Pulse 84   Temp 98.2 F (36.8 C) (Oral)   Resp 16   LMP 08/27/2019  SpO2 100%   Physical Exam Vitals and nursing note reviewed.  Constitutional:      General: She is not in acute distress.    Appearance: Normal appearance. She is well-developed. She is not toxic-appearing.  HENT:     Head: Normocephalic and atraumatic.  Eyes:     General: Lids are normal.     Conjunctiva/sclera: Conjunctivae normal.     Pupils: Pupils are equal, round, and reactive to light.  Neck:     Thyroid: No thyroid mass.     Trachea: No tracheal deviation.  Cardiovascular:     Rate and Rhythm: Normal rate and regular rhythm.     Heart sounds: Normal heart sounds. No murmur heard.  No gallop.   Pulmonary:     Effort: Pulmonary effort is normal. No respiratory distress.     Breath sounds: Normal breath sounds. No stridor. No decreased breath sounds, wheezing, rhonchi or rales.  Abdominal:      General: Bowel sounds are normal. There is no distension.     Palpations: Abdomen is soft.     Tenderness: There is generalized abdominal tenderness and tenderness in the right upper quadrant. There is no rebound.    Musculoskeletal:        General: No tenderness. Normal range of motion.     Cervical back: Normal range of motion and neck supple.  Skin:    General: Skin is warm and dry.     Findings: No abrasion or rash.  Neurological:     Mental Status: She is alert and oriented to person, place, and time.     GCS: GCS eye subscore is 4. GCS verbal subscore is 5. GCS motor subscore is 6.     Cranial Nerves: No cranial nerve deficit.     Sensory: No sensory deficit.  Psychiatric:        Speech: Speech normal.        Behavior: Behavior normal.     ED Results / Procedures / Treatments   Labs (all labs ordered are listed, but only abnormal results are displayed) Labs Reviewed  BASIC METABOLIC PANEL - Abnormal; Notable for the following components:      Result Value   Chloride 97 (*)    Glucose, Bld 108 (*)    All other components within normal limits  CBC - Abnormal; Notable for the following components:   MCV 101.3 (*)    All other components within normal limits  TSH  LIPASE, BLOOD  HEPATIC FUNCTION PANEL  I-STAT BETA HCG BLOOD, ED ([facility], [facility], [facility] ONLY)  I-STAT BETA HCG BLOOD, ED (NOT ORDERABLE)  TROPONIN I (HIGH SENSITIVITY)  TROPONIN I (HIGH SENSITIVITY)    EKG EKG Interpretation  Date/Time:  Tuesday September 23 2019 07:25:33 EDT Ventricular Rate:  103 PR Interval:    QRS Duration: 114 QT Interval:  357 QTC Calculation: 468 R Axis:   79 Text Interpretation: Sinus tachycardia Ventricular bigeminy Incomplete right bundle branch block No old tracing to compare Confirmed by Meridee Score [phone]) on 09/23/2019 7:37:56 AM   Radiology DG Chest 2 View  Result Date: 09/23/2019 CLINICAL DATA:  Center chest pain EXAM: CHEST - 2 VIEW COMPARISON:  March 22, 2009 FINDINGS:  The cardiomediastinal silhouette is normal in contour. No pleural effusion. No pneumothorax. No acute pleuroparenchymal abnormality. Visualized abdomen is unremarkable. No acute osseous abnormality noted. IMPRESSION: No acute cardiopulmonary abnormality. Electronically Signed   By: Meda Klinefelter MD   On: 09/23/2019 07:52    Procedures Procedures (  including critical care time)  Medications Ordered in ED Medications  alum & mag hydroxide-simeth (MAALOX/MYLANTA) 200-200-20 MG/5ML suspension 30 mL (has no administration in time range)    And  lidocaine (XYLOCAINE) 2 % viscous mouth solution 15 mL (has no administration in time range)  sucralfate (CARAFATE) tablet 1 g (has no administration in time range)    ED Course  I have reviewed the triage vital signs and the nursing notes.  Pertinent labs & imaging results that were available during my care of the patient were reviewed by me and considered in my medical decision making (see chart for details).    MDM Rules/Calculators/A&P                          Patient's labs and x-rays reviewed here.  Suspect GERD and low suspicion for ACS.  Given medication for this and she does feel better.  Will discharge home and give prescription for PPI as well as Carafate Final Clinical Impression(s) / ED Diagnoses Final diagnoses:  None    Rx / DC Orders ED Discharge Orders    None       Lorre Nick, MD 09/23/19 1357

## 2019-09-23 NOTE — ED Triage Notes (Addendum)
Pt states that she started having a dull chest pain last night. Pt states pain is in sternal area and wraps under left breast. Pt states that she was recently started on hypertension meds.  Pt is also concerned about her thyroid levels, states that there is a hx of issues with thyroids in her family.

## 2020-04-27 ENCOUNTER — Ambulatory Visit
Admission: EM | Admit: 2020-04-27 | Discharge: 2020-04-27 | Disposition: A | Discharge status: Home / Self Care | Payer: Self-pay | Attending: Family Medicine | Admitting: Family Medicine

## 2020-04-27 ENCOUNTER — Other Ambulatory Visit: Payer: Self-pay

## 2020-04-27 ENCOUNTER — Encounter: Payer: Self-pay | Admitting: Emergency Medicine

## 2020-04-27 DIAGNOSIS — J0100 Acute maxillary sinusitis, unspecified: Secondary | ICD-10-CM

## 2020-04-27 MED ORDER — BENZONATATE 100 MG PO CAPS: 100.0000 mg | ORAL_CAPSULE | Freq: Three times a day (TID) | ORAL | 0 refills | Status: DC | PRN

## 2020-04-27 MED ORDER — ONDANSETRON HCL 4 MG PO TABS: 4.0000 mg | ORAL_TABLET | Freq: Three times a day (TID) | ORAL | 0 refills | Status: DC | PRN

## 2020-04-27 MED ORDER — DOXYCYCLINE HYCLATE 100 MG PO CAPS
100.0000 mg | ORAL_CAPSULE | Freq: Two times a day (BID) | ORAL | 0 refills | Status: AC
Start: 2020-04-27 — End: 2020-05-04

## 2020-04-27 NOTE — ED Provider Notes (Signed)
[facility]    CSN: [id number] Arrival date & time: 04/27/20  0935      History   Chief Complaint Chief Complaint  Patient presents with  . Cough  . Chest Pain  . Dizziness  . Sore Throat  . Facial Pain    HPI Misty Bauer is a 35 y.o. female.   HPI Patient presents today for evaluation of 2 weeks of cough, chest congestion, dizziness, sore throat and facial pressure. Patient reports a history of chronic allergy symptoms, vertigo, and inner ear issues. She has not had fever with current symptoms.  She is attempted to manage with her current allergy regimen without relief of symptoms.  Patient is a current smoker.  She also has had cough and soreness of throat causing pain with eating. Past Medical History:  Diagnosis Date  . Allergy   . Anxiety   . 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 Depression   . GERD (gastroesophageal reflux disease)    occasional - diet controlled - tums prn  . History of kidney stones    passes stones, no surgery required  . ITP (idiopathic thrombocytopenic purpura)    In remission since 2008  . Migraine    last one 02/2016 - weather / smells are triggers    Patient Active Problem List   Diagnosis Date Noted  . Pain of finger of left hand 10/19/2017  . Common wart 08/19/2015  . Migraine with aura and without status migrainosus, not intractable 10/09/2014  . Worsening headaches 10/09/2014    Past Surgical History:  Procedure Laterality Date  . DILATION AND EVACUATION N/A 05/03/2016   Procedure: DILATATION AND EVACUATION WITH  ULTRASOUND GUIDANCE ;  Surgeon: Olivia Mackie, MD;  Location: [facility];  Service: Gynecology;  Laterality: N/A;  NEEDS ULTRASOUND   . WISDOM TOOTH EXTRACTION  2014    OB History   No obstetric history on file.      Home Medications    Prior to Admission medications   Medication Sig Start Date End Date Taking? Authorizing Provider  benzonatate (TESSALON) 100 MG capsule  Take 1-2 capsules (100-200 mg total) by mouth 3 (three) times daily as needed for cough. 04/27/20  Yes Bing Neighbors, FNP  doxycycline (VIBRAMYCIN) 100 MG capsule Take 1 capsule (100 mg total) by mouth 2 (two) times daily for 7 days. 04/27/20 05/04/20 Yes Bing Neighbors, FNP  ondansetron (ZOFRAN) 4 MG tablet Take 1 tablet (4 mg total) by mouth every 8 (eight) hours as needed for nausea or vomiting. 04/27/20  Yes Bing Neighbors, FNP  cetirizine (ZYRTEC) 10 MG tablet Take 10 mg by mouth daily as needed for allergies.    [provider]  eletriptan (RELPAX) 20 MG tablet TAKE 1 TAB BY MOUTH AS NEEDED FOR MIGRAINE. MAY REPEAT IN 2 HRS IF PERSISTS. DO NOT TAKE OVER 3X/WK 06/19/19   Cathie Hoops, Amy V, PA-C  nortriptyline (PAMELOR) 10 MG capsule Take 4 capsules at bedtime. 04/23/18   Van Clines, MD  ondansetron (ZOFRAN ODT) 4 MG disintegrating tablet Take 1 tablet (4 mg total) by mouth every 8 (eight) hours as needed for nausea or vomiting. 06/19/19   Cathie Hoops, Amy V, PA-C  pantoprazole (PROTONIX) 20 MG tablet Take 1 tablet (20 mg total) by mouth 2 (two) times daily. 09/23/19   Lorre Nick, MD  sucralfate (CARAFATE) 1 g tablet Take 1 tablet (1 g total) by mouth 4 (four) times daily.  09/23/19   Lorre Nick, MD    Family History Family History  Problem Relation Age of Onset  . Thyroid disease Mother   . Hyperlipidemia Maternal Grandmother     Social History Social History   Tobacco Use  . Smoking status: Current Every Day Smoker    Packs/day: 1.00    Years: 14.00    Pack years: 14.00    Types: Cigarettes  . Smokeless tobacco: Never Used  Vaping Use  . Vaping Use: Some days  Substance Use Topics  . Alcohol use: Yes    Alcohol/week: 0.0 standard drinks    Comment: Beer/Liquor 4 drinks  . Drug use: No     Allergies   Amoxicillin, Coconut flavor, and Penicillins   Review of Systems Review of Systems Pertinent negatives listed in HPI  Physical Exam Triage Vital Signs ED  Triage Vitals  Enc Vitals Group     BP 04/27/20 1001 (!) 158/101     Pulse Rate 04/27/20 1001 (!) 103     Resp 04/27/20 1001 17     Temp 04/27/20 1001 98.3 F (36.8 C)     Temp Source 04/27/20 1001 Oral     SpO2 04/27/20 1001 99 %     Weight --      Height --      Head Circumference --      Peak Flow --      Pain Score 04/27/20 1000 5     Pain Loc --      Pain Edu? --      Excl. in GC? --    No data found.  Updated Vital Signs BP (!) 158/101 (BP Location: Left Arm)   Pulse (!) 103   Temp 98.3 F (36.8 C) (Oral)   Resp 17   LMP 04/25/2020   SpO2 99%   Visual Acuity Right Eye Distance:   Left Eye Distance:   Bilateral Distance:    Right Eye Near:   Left Eye Near:    Bilateral Near:     Physical Exam General appearance: alert, Ill-appearing, no distress Head: Normocephalic, without obvious abnormality, atraumatic ENT: External ears, nares with mucosal edema, congestion, oropharynx w/o exudate Respiratory: Respirations even , unlabored, CTABL Heart: rate and rhythm normal. No gallop or murmurs noted on exam  Abdomen: BS +, no distention, no rebound tenderness, or no mass Extremities: No gross deformities Skin: Skin color, texture, turgor normal. No rashes seen  Psych: Appropriate mood and affect. Neurologic: GCS 15, normal coordination, normal gait   UC Treatments / Results  Labs (all labs ordered are listed, but only abnormal results are displayed) Labs Reviewed - No data to display  EKG   Radiology No results found.  Procedures Procedures (including critical care time)  Medications Ordered in UC Medications - No data to display  Initial Impression / Assessment and Plan / UC Course  I have reviewed the triage vital signs and the nursing notes.  Pertinent labs & imaging results that were available during my care of the patient were reviewed by me and considered in my medical decision making (see chart for details).     Acute sinusitis, treatment  with empiric therapy with doxycycline twice daily for 7 days, benzonatate Perles for cough and Zofran for nausea symptoms.  Recommended ibuprofen or naproxen for management of throat pain. Final Clinical Impressions(s) / UC Diagnoses   Final diagnoses:  Acute maxillary sinusitis, recurrence not specified   Discharge Instructions   None    ED  Prescriptions    Medication Sig Dispense Auth. Provider   benzonatate (TESSALON) 100 MG capsule Take 1-2 capsules (100-200 mg total) by mouth 3 (three) times daily as needed for cough. 40 capsule Bing Neighbors, FNP   doxycycline (VIBRAMYCIN) 100 MG capsule Take 1 capsule (100 mg total) by mouth 2 (two) times daily for 7 days. 14 capsule Bing Neighbors, FNP   ondansetron (ZOFRAN) 4 MG tablet Take 1 tablet (4 mg total) by mouth every 8 (eight) hours as needed for nausea or vomiting. 20 tablet Bing Neighbors, FNP     PDMP not reviewed this encounter.   Bing Neighbors, FNP 04/27/20 1102

## 2020-04-27 NOTE — ED Triage Notes (Signed)
Pt presents with sinus pressure/ pain, sore throat, congestion, and chest soreness xs 2 weeks. States has been dizzy but had hx of vertigo.

## 2020-08-02 DIAGNOSIS — Z20822 Contact with and (suspected) exposure to covid-19: Secondary | ICD-10-CM | POA: Diagnosis not present

## 2020-08-07 ENCOUNTER — Encounter (HOSPITAL_COMMUNITY): Payer: Self-pay | Admitting: Emergency Medicine

## 2020-08-07 ENCOUNTER — Emergency Department (HOSPITAL_COMMUNITY): Payer: BC Managed Care – PPO

## 2020-08-07 ENCOUNTER — Other Ambulatory Visit: Payer: Self-pay

## 2020-08-07 ENCOUNTER — Emergency Department (HOSPITAL_COMMUNITY)
Admission: EM | Admit: 2020-08-07 | Discharge: 2020-08-07 | Disposition: A | Discharge status: Home / Self Care | Payer: BC Managed Care – PPO | Attending: Emergency Medicine | Admitting: Emergency Medicine

## 2020-08-07 DIAGNOSIS — R21 Rash and other nonspecific skin eruption: Secondary | ICD-10-CM | POA: Diagnosis not present

## 2020-08-07 DIAGNOSIS — K219 Gastro-esophageal reflux disease without esophagitis: Secondary | ICD-10-CM | POA: Insufficient documentation

## 2020-08-07 DIAGNOSIS — R1032 Left lower quadrant pain: Secondary | ICD-10-CM | POA: Diagnosis not present

## 2020-08-07 DIAGNOSIS — F17210 Nicotine dependence, cigarettes, uncomplicated: Secondary | ICD-10-CM | POA: Insufficient documentation

## 2020-08-07 DIAGNOSIS — Z862 Personal history of diseases of the blood and blood-forming organs and certain disorders involving the immune mechanism: Secondary | ICD-10-CM | POA: Diagnosis not present

## 2020-08-07 DIAGNOSIS — R42 Dizziness and giddiness: Secondary | ICD-10-CM | POA: Insufficient documentation

## 2020-08-07 DIAGNOSIS — R5383 Other fatigue: Secondary | ICD-10-CM | POA: Diagnosis not present

## 2020-08-07 DIAGNOSIS — R1030 Lower abdominal pain, unspecified: Secondary | ICD-10-CM

## 2020-08-07 LAB — BASIC METABOLIC PANEL
Anion gap: 13 (ref 5–15)
BUN: 8 mg/dL (ref 6–20)
CO2: 27 mmol/L (ref 22–32)
Calcium: 9.9 mg/dL (ref 8.9–10.3)
Chloride: 96 mmol/L — ABNORMAL LOW (ref 98–111)
Creatinine, Ser: 0.67 mg/dL (ref 0.44–1.00)
GFR, Estimated: >60 mL/min (ref >60)
Glucose, Bld: 104 mg/dL — ABNORMAL HIGH (ref 70–99)
Potassium: 3 mmol/L — ABNORMAL LOW (ref 3.5–5.1)
Sodium: 136 mmol/L (ref 135–145)

## 2020-08-07 LAB — CBC WITH DIFFERENTIAL/PLATELET
Abs Immature Granulocytes: 0.04 10*3/uL (ref 0.00–0.07)
Basophils Absolute: 0.1 10*3/uL (ref 0.0–0.1)
Basophils Relative: 1 %
Eosinophils Absolute: 0 10*3/uL (ref 0.0–0.5)
Eosinophils Relative: 0 %
HCT: 40 % (ref 36.0–46.0)
Hemoglobin: 13.8 g/dL (ref 12.0–15.0)
Immature Granulocytes: 0 %
Lymphocytes Relative: 13 %
Lymphs Abs: 1.4 10*3/uL (ref 0.7–4.0)
MCH: 34.3 pg — ABNORMAL HIGH (ref 26.0–34.0)
MCHC: 34.5 g/dL (ref 30.0–36.0)
MCV: 99.5 fL (ref 80.0–100.0)
Monocytes Absolute: 0.9 10*3/uL (ref 0.1–1.0)
Monocytes Relative: 9 %
Neutro Abs: 8 10*3/uL — ABNORMAL HIGH (ref 1.7–7.7)
Neutrophils Relative %: 77 %
Platelets: 236 10*3/uL (ref 150–400)
RBC: 4.02 MIL/uL (ref 3.87–5.11)
RDW: 13.9 % (ref 11.5–15.5)
WBC: 10.5 10*3/uL (ref 4.0–10.5)
nRBC: 0 % (ref 0.0–0.2)

## 2020-08-07 LAB — URINALYSIS, ROUTINE W REFLEX MICROSCOPIC
Bilirubin Urine: NEGATIVE
Glucose, UA: NEGATIVE mg/dL
Hgb urine dipstick: NEGATIVE
Ketones, ur: 20 mg/dL — AB
Leukocytes,Ua: NEGATIVE
Nitrite: NEGATIVE
Protein, ur: 100 mg/dL — AB
Specific Gravity, Urine: 1.019 (ref 1.005–1.030)
pH: 7 (ref 5.0–8.0)

## 2020-08-07 LAB — PREGNANCY, URINE: Preg Test, Ur: NEGATIVE

## 2020-08-07 MED ORDER — POTASSIUM CHLORIDE CRYS ER 20 MEQ PO TBCR
40.0000 meq | EXTENDED_RELEASE_TABLET | Freq: Once | ORAL | Status: AC
  Administered 2020-08-07: 40 meq via ORAL
  Filled 2020-08-07: qty 2

## 2020-08-07 MED ORDER — IBUPROFEN 800 MG PO TABS
800.0000 mg | ORAL_TABLET | Freq: Once | ORAL | Status: AC
  Administered 2020-08-07: 800 mg via ORAL
  Filled 2020-08-07: qty 1

## 2020-08-07 MED ORDER — SODIUM CHLORIDE 0.9 % IV BOLUS
1000.0000 mL | Freq: Once | INTRAVENOUS | Status: AC
  Administered 2020-08-07: 1000 mL via INTRAVENOUS

## 2020-08-07 MED ORDER — ACETAMINOPHEN 325 MG PO TABS
650.0000 mg | ORAL_TABLET | Freq: Once | ORAL | Status: AC
  Administered 2020-08-07: 650 mg via ORAL
  Filled 2020-08-07: qty 2

## 2020-08-07 NOTE — Discharge Instructions (Addendum)
YOu were seen today with concerns for worsening of your ITP.  Your lab work-up is all reassuring.  He also had some abdominal discomfort.  This could be related to your current menses.  Your work-up is reassuring.  Take Tylenol as needed for pain.

## 2020-08-07 NOTE — ED Provider Notes (Signed)
[facility]-EMERGENCY DEPT Provider Note   CSN: [id number] Arrival date & time: 08/07/20  1726     History Chief Complaint  Patient presents with   Coagulation Disorder    Misty Bauer is a 35 y.o. female.  HPI     This a 35 year old female with a history of ITP who presents with concerns for bleeding.  Patient reports over the last week or so she has noticed increased fatigue, bleeding of her gums, punctate rash on her legs.  She also has started her period and believes this may have triggered her ITP.  She has not had recurrence of her ITP in many years.  She has only ever had steroids and has not required IVIG.  She states she just felt very tired and lightheaded today.  She is currently on her menstrual period.  Has not noticed any significant increased bleeding.  Does report some lower abdominal cramping.  Past Medical History:  Diagnosis Date   Allergy    Anxiety    Clotting disorder (HCC)    In remission since 2008 - ITP    Depression    GERD (gastroesophageal reflux disease)    occasional - diet controlled - tums prn   History of kidney stones    passes stones, no surgery required   ITP (idiopathic thrombocytopenic purpura)    In remission since 2008   Migraine    last one 02/2016 - weather / smells are triggers    Patient Active Problem List   Diagnosis Date Noted   Pain of finger of left hand 10/19/2017   Common wart 08/19/2015   Migraine with aura and without status migrainosus, not intractable 10/09/2014   Worsening headaches 10/09/2014    Past Surgical History:  Procedure Laterality Date   DILATION AND EVACUATION N/A 05/03/2016   Procedure: DILATATION AND EVACUATION WITH  ULTRASOUND GUIDANCE ;  Surgeon: Olivia Mackie, MD;  Location: [facility];  Service: Gynecology;  Laterality: N/A;  NEEDS ULTRASOUND    WISDOM TOOTH EXTRACTION  2014     OB History   No obstetric history on file.     Family History  Problem Relation Age of  Onset   Thyroid disease Mother    Hyperlipidemia Maternal Grandmother     Social History   Tobacco Use   Smoking status: Every Day    Packs/day: 1.00    Years: 14.00    Pack years: 14.00    Types: Cigarettes   Smokeless tobacco: Never  Vaping Use   Vaping Use: Some days  Substance Use Topics   Alcohol use: Yes    Alcohol/week: 0.0 standard drinks    Comment: Beer/Liquor 4 drinks   Drug use: No    Home Medications Prior to Admission medications   Medication Sig Start Date End Date Taking? Authorizing Provider  benzonatate (TESSALON) 100 MG capsule Take 1-2 capsules (100-200 mg total) by mouth 3 (three) times daily as needed for cough. 04/27/20   Bing Neighbors, FNP  cetirizine (ZYRTEC) 10 MG tablet Take 10 mg by mouth daily as needed for allergies.    [provider]  eletriptan (RELPAX) 20 MG tablet TAKE 1 TAB BY MOUTH AS NEEDED FOR MIGRAINE. MAY REPEAT IN 2 HRS IF PERSISTS. DO NOT TAKE OVER 3X/WK 06/19/19   Cathie Hoops, Amy V, PA-C  nortriptyline (PAMELOR) 10 MG capsule Take 4 capsules at bedtime. 04/23/18   Van Clines, MD  ondansetron (ZOFRAN ODT) 4 MG disintegrating tablet Take 1 tablet (4  mg total) by mouth every 8 (eight) hours as needed for nausea or vomiting. 06/19/19   Cathie Hoops, Amy V, PA-C  ondansetron (ZOFRAN) 4 MG tablet Take 1 tablet (4 mg total) by mouth every 8 (eight) hours as needed for nausea or vomiting. 04/27/20   Bing Neighbors, FNP  pantoprazole (PROTONIX) 20 MG tablet Take 1 tablet (20 mg total) by mouth 2 (two) times daily. 09/23/19   Lorre Nick, MD  sucralfate (CARAFATE) 1 g tablet Take 1 tablet (1 g total) by mouth 4 (four) times daily. 09/23/19   Lorre Nick, MD    Allergies    Amoxicillin, Coconut flavor, and Penicillins  Review of Systems   Review of Systems  Constitutional:  Positive for fatigue. Negative for fever.  Respiratory:  Negative for shortness of breath.   Cardiovascular:  Negative for chest pain.  Gastrointestinal:  Negative  for abdominal pain, diarrhea and vomiting.  Skin:  Positive for rash.  Neurological:  Positive for light-headedness.  All other systems reviewed and are negative.  Physical Exam Updated Vital Signs BP (!) 139/96   Pulse 82   Temp 98.1 F (36.7 C) (Oral)   Resp 18   Ht 1.575 m (5\' 2" )   Wt 37.6 kg   SpO2 100%   BMI 15.18 kg/m   Physical Exam Vitals and nursing note reviewed.  Constitutional:      Appearance: She is well-developed. She is not ill-appearing.  HENT:     Head: Normocephalic and atraumatic.     Nose: Nose normal.     Mouth/Throat:     Mouth: Mucous membranes are moist.  Eyes:     Pupils: Pupils are equal, round, and reactive to light.  Cardiovascular:     Rate and Rhythm: Normal rate and regular rhythm.     Heart sounds: Normal heart sounds.  Pulmonary:     Effort: Pulmonary effort is normal. No respiratory distress.     Breath sounds: No wheezing.  Abdominal:     General: Bowel sounds are normal.     Palpations: Abdomen is soft.     Tenderness: There is no guarding or rebound.  Musculoskeletal:     Cervical back: Neck supple.  Skin:    General: Skin is warm and dry.     Comments: No petechiae or purpura noted  Neurological:     Mental Status: She is alert and oriented to person, place, and time.  Psychiatric:        Mood and Affect: Mood normal.    ED Results / Procedures / Treatments   Labs (all labs ordered are listed, but only abnormal results are displayed) Labs Reviewed  CBC WITH DIFFERENTIAL/PLATELET - Abnormal; Notable for the following components:      Result Value   MCH 34.3 (*)    Neutro Abs 8.0 (*)    All other components within normal limits  BASIC METABOLIC PANEL - Abnormal; Notable for the following components:   Potassium 3.0 (*)    Chloride 96 (*)    Glucose, Bld 104 (*)    All other components within normal limits  URINALYSIS, ROUTINE W REFLEX MICROSCOPIC - Abnormal; Notable for the following components:   Color, Urine  AMBER (*)    APPearance HAZY (*)    Ketones, ur 20 (*)    Protein, ur 100 (*)    Bacteria, UA RARE (*)    All other components within normal limits  PREGNANCY, URINE    EKG None  Radiology  Pelvis Complete  Result Date: 08/07/2020 CLINICAL DATA:  Left lower quadrant pain. EXAM: TRANSABDOMINAL ULTRASOUND OF PELVIS TECHNIQUE: Transabdominal ultrasound examination of the pelvis was performed including evaluation of the uterus, ovaries, adnexal regions, and pelvic cul-de-sac. COMPARISON:  None. FINDINGS: Uterus Measurements: 7.8 x 3.9 x 4.7 cm = volume: 74 mL. The uterus is anteverted. No fibroids or other mass visualized. Endometrium Thickness: 6 mm, normal.  No focal abnormality visualized. Right ovary Measurements: 3.0 x 1.7 x 1.5 cm = volume: 4.1 mL. Normal appearance. Blood flow is seen. Small follicles. No cyst or adnexal mass. Left ovary Measurements: 3.6 x 2.3 x 2.3 cm = volume: 9.9 mL. Normal appearance. Physiologic follicles. Blood flow is seen. No adnexal mass. Other findings:  No abnormal free fluid. IMPRESSION: Normal pelvic ultrasound. Electronically Signed   By: Narda Rutherford M.D.   On: 08/07/2020 21:58    Procedures Procedures   Medications Ordered in ED Medications  sodium chloride 0.9 % bolus 1,000 mL (0 mLs Intravenous Stopped 08/07/20 2202)  potassium chloride SA (KLOR-CON) CR tablet 40 mEq (40 mEq Oral Given 08/07/20 2003)  acetaminophen (TYLENOL) tablet 650 mg (650 mg Oral Given 08/07/20 2003)    ED Course  I have reviewed the triage vital signs and the nursing notes.  Pertinent labs & imaging results that were available during my care of the patient were reviewed by me and considered in my medical decision making (see chart for details).    MDM Rules/Calculators/A&P                           Patient presents with symptoms concerning for potential exacerbation of her ITP.  Has not had symptoms in the past.  She is overall nontoxic and vital signs are  reassuring.  I do not see any evidence of purpura or petechiae on exam.  She notes some spots but these are not consistent with the above.  She has no active bleeding on exam and is hemodynamically stable.  She does report some lower abdominal cramping.  She was given Tylenol.  Labs obtained.  Hemoglobin is completely normal.  Urinalysis without evidence of UTI.  She is not pregnant.  Platelets are also normal.  Patient ultimately ended up mostly complaining of some lower abdominal discomfort.  She reports no vaginal discharge and is unconcerned about STDs.  Pelvic exam deferred.  I did obtain a pelvic ultrasound which was normal and does not show any evidence of ovarian pathology.  Patient reassured.  Likely related to current period.  After history, exam, and medical workup I feel the patient has been appropriately medically screened and is safe for discharge home. Pertinent diagnoses were discussed with the patient. Patient was given return precautions.  Final Clinical Impression(s) / ED Diagnoses Final diagnoses:  Lower abdominal pain  History of ITP    Rx / DC Orders ED Discharge Orders     None        Shon Baton, MD 08/07/20 2252

## 2020-08-07 NOTE — ED Triage Notes (Signed)
Patient thinks she is having a flare-up of her ITP, has purpura, bruising easily x1 week and is now on her period.

## 2021-01-25 DIAGNOSIS — Z3202 Encounter for pregnancy test, result negative: Secondary | ICD-10-CM | POA: Diagnosis not present

## 2021-01-25 DIAGNOSIS — O209 Hemorrhage in early pregnancy, unspecified: Secondary | ICD-10-CM | POA: Diagnosis not present

## 2021-01-27 DIAGNOSIS — O209 Hemorrhage in early pregnancy, unspecified: Secondary | ICD-10-CM | POA: Diagnosis not present

## 2021-01-27 DIAGNOSIS — Z3A01 Less than 8 weeks gestation of pregnancy: Secondary | ICD-10-CM | POA: Diagnosis not present

## 2021-02-03 DIAGNOSIS — Z3A01 Less than 8 weeks gestation of pregnancy: Secondary | ICD-10-CM | POA: Diagnosis not present

## 2021-02-03 DIAGNOSIS — O209 Hemorrhage in early pregnancy, unspecified: Secondary | ICD-10-CM | POA: Diagnosis not present

## 2021-02-16 ENCOUNTER — Encounter: Payer: Self-pay | Admitting: Emergency Medicine

## 2021-02-16 ENCOUNTER — Ambulatory Visit
Admission: EM | Admit: 2021-02-16 | Discharge: 2021-02-16 | Disposition: A | Discharge status: Home / Self Care | Payer: BC Managed Care – PPO | Attending: Medical Oncology | Admitting: Medical Oncology

## 2021-02-16 ENCOUNTER — Other Ambulatory Visit: Payer: Self-pay

## 2021-02-16 DIAGNOSIS — R051 Acute cough: Secondary | ICD-10-CM

## 2021-02-16 DIAGNOSIS — H66002 Acute suppurative otitis media without spontaneous rupture of ear drum, left ear: Secondary | ICD-10-CM

## 2021-02-16 DIAGNOSIS — R509 Fever, unspecified: Secondary | ICD-10-CM | POA: Diagnosis not present

## 2021-02-16 MED ORDER — BENZONATATE 100 MG PO CAPS: 100.0000 mg | ORAL_CAPSULE | Freq: Three times a day (TID) | ORAL | 0 refills | Status: AC

## 2021-02-16 MED ORDER — AZITHROMYCIN 250 MG PO TABS: 250.0000 mg | ORAL_TABLET | Freq: Every day | ORAL | 0 refills | Status: DC

## 2021-02-16 MED ORDER — FLUTICASONE PROPIONATE 50 MCG/ACT NA SUSP: 2.0000 | Freq: Every day | NASAL | 0 refills | Status: AC

## 2021-02-16 MED ORDER — ONDANSETRON HCL 4 MG PO TABS: 4.0000 mg | ORAL_TABLET | Freq: Four times a day (QID) | ORAL | 0 refills | Status: AC

## 2021-02-16 NOTE — ED Triage Notes (Signed)
Pt here with cough, congestion, fever, sore throat, headache, fatigue, and body aches for over a week. Pt states she started to get better, but then began to feel worse again about 3 days ago.

## 2021-02-16 NOTE — ED Provider Notes (Signed)
Roderic Palau    CSN: [id number] Arrival date & time: 02/16/21  1152      History   Chief Complaint Chief Complaint  Patient presents with   Cough   Nasal Congestion   Sore Throat   Generalized Body Aches   Fever   Headache    HPI Misty Bauer is a 36 y.o. female.   HPI  Cold Symptoms: Patient reports that they have had symptoms of cough, nasal congestion, sore throat, body aches, fever, headache, ear pain for the past 7 days. Symptoms are worsening over the past 3 days. They deny SOB, chest pain, fever or vomiting. They have tried various OTC medication for symptoms. No known sick contacts.    Past Medical History:  Diagnosis Date   Allergy    Anxiety    Clotting disorder (Accident)    In remission since 2008 - ITP    Depression    GERD (gastroesophageal reflux disease)    occasional - diet controlled - tums prn   History of kidney stones    passes stones, no surgery required   ITP (idiopathic thrombocytopenic purpura)    In remission since 2008   Migraine    last one 02/2016 - weather / smells are triggers    Patient Active Problem List   Diagnosis Date Noted   Pain of finger of left hand 10/19/2017   Common wart 08/19/2015   Migraine with aura and without status migrainosus, not intractable 10/09/2014   Worsening headaches 10/09/2014    Past Surgical History:  Procedure Laterality Date   DILATION AND EVACUATION N/A 05/03/2016   Procedure: DILATATION AND EVACUATION WITH  ULTRASOUND GUIDANCE ;  Surgeon: Brien Few, MD;  Location: [facility];  Service: Gynecology;  Laterality: N/A;  NEEDS ULTRASOUND    WISDOM TOOTH EXTRACTION  2014    OB History   No obstetric history on file.      Home Medications    Prior to Admission medications   Not on File    Family History Family History  Problem Relation Age of Onset   Thyroid disease Mother    Hyperlipidemia Maternal Grandmother     Social History Social History   Tobacco Use   Smoking  status: Every Day    Packs/day: 1.00    Years: 14.00    Pack years: 14.00    Types: Cigarettes   Smokeless tobacco: Never  Vaping Use   Vaping Use: Some days  Substance Use Topics   Alcohol use: Yes    Alcohol/week: 0.0 standard drinks    Comment: Beer/Liquor 4 drinks   Drug use: No     Allergies   Amoxicillin, Coconut flavor, and Penicillins   Review of Systems Review of Systems  As stated above in HPI Physical Exam Triage Vital Signs ED Triage Vitals  Enc Vitals Group     BP 02/16/21 1315 (!) 156/103     Pulse Rate 02/16/21 1315 88     Resp 02/16/21 1315 16     Temp 02/16/21 1315 99.1 F (37.3 C)     Temp Source 02/16/21 1315 Oral     SpO2 02/16/21 1315 96 %     Weight --      Height --      Head Circumference --      Peak Flow --      Pain Score 02/16/21 1323 5     Pain Loc --      Pain Edu? --  Excl. in GC? --    No data found.  Updated Vital Signs BP (!) 156/103 (BP Location: Left Arm)    Pulse 88    Temp 99.1 F (37.3 C) (Oral)    Resp 16    SpO2 96%   Physical Exam Vitals and nursing note reviewed.  Constitutional:      General: She is not in acute distress.    Appearance: Normal appearance. She is not ill-appearing, toxic-appearing or diaphoretic.  HENT:     Head: Normocephalic and atraumatic.     Ears:     Comments: Erythema and edema of the left TM    Nose: Congestion and rhinorrhea present.     Mouth/Throat:     Mouth: Mucous membranes are moist.     Pharynx: Oropharynx is clear. No oropharyngeal exudate or posterior oropharyngeal erythema.  Eyes:     General:        Right eye: No discharge.        Left eye: No discharge.     Extraocular Movements: Extraocular movements intact.     Conjunctiva/sclera: Conjunctivae normal.     Pupils: Pupils are equal, round, and reactive to light.  Cardiovascular:     Rate and Rhythm: Normal rate and regular rhythm.     Heart sounds: Normal heart sounds.  Pulmonary:     Effort: Pulmonary effort  is normal. No respiratory distress.     Breath sounds: Normal breath sounds. No stridor. No wheezing or rhonchi.  Abdominal:     Palpations: Abdomen is soft.  Musculoskeletal:     Cervical back: Normal range of motion and neck supple.  Lymphadenopathy:     Cervical: No cervical adenopathy.  Skin:    General: Skin is warm.     Coloration: Skin is not jaundiced.     Findings: No erythema or rash.  Neurological:     Mental Status: She is alert and oriented to person, place, and time.     UC Treatments / Results  Labs (all labs ordered are listed, but only abnormal results are displayed) Labs Reviewed - No data to display  EKG   Radiology No results found.  Procedures Procedures (including critical care time)  Medications Ordered in UC Medications - No data to display  Initial Impression / Assessment and Plan / UC Course  I have reviewed the triage vital signs and the nursing notes.  Pertinent labs & imaging results that were available during my care of the patient were reviewed by me and considered in my medical decision making (see chart for details).     New. Treating for AOM and covering for most types of potential pneumonia given new fever day out from cough.As we are covering with azithromycin x ray was discussed but deferred.  Discussed red flag signs and symptoms. Continue rest, hydration, zofran, tessalon and flonase as needed Follow up PRN.  Final Clinical Impressions(s) / UC Diagnoses   Final diagnoses:  None   Discharge Instructions   None    ED Prescriptions   None    PDMP not reviewed this encounter.   Hughie Closs, [place] 02/16/21 1401

## 2021-03-02 DIAGNOSIS — N96 Recurrent pregnancy loss: Secondary | ICD-10-CM | POA: Diagnosis not present

## 2021-03-03 DIAGNOSIS — R946 Abnormal results of thyroid function studies: Secondary | ICD-10-CM | POA: Diagnosis not present

## 2021-03-04 ENCOUNTER — Encounter: Payer: Self-pay | Admitting: Nurse Practitioner

## 2021-03-04 ENCOUNTER — Other Ambulatory Visit: Payer: Self-pay

## 2021-03-04 ENCOUNTER — Ambulatory Visit (INDEPENDENT_AMBULATORY_CARE_PROVIDER_SITE_OTHER): Payer: BC Managed Care – PPO | Admitting: Nurse Practitioner

## 2021-03-04 VITALS — BP 142/83 | HR 76 | Temp 97.9°F | Ht 62.0 in | Wt 95.4 lb

## 2021-03-04 DIAGNOSIS — I10 Essential (primary) hypertension: Secondary | ICD-10-CM | POA: Diagnosis not present

## 2021-03-04 DIAGNOSIS — R591 Generalized enlarged lymph nodes: Secondary | ICD-10-CM

## 2021-03-04 DIAGNOSIS — Z7689 Persons encountering health services in other specified circumstances: Secondary | ICD-10-CM | POA: Diagnosis not present

## 2021-03-04 DIAGNOSIS — E0590 Thyrotoxicosis, unspecified without thyrotoxic crisis or storm: Secondary | ICD-10-CM

## 2021-03-04 DIAGNOSIS — E010 Iodine-deficiency related diffuse (endemic) goiter: Secondary | ICD-10-CM

## 2021-03-04 MED ORDER — PROPRANOLOL HCL 10 MG PO TABS: 10.0000 mg | ORAL_TABLET | Freq: Two times a day (BID) | ORAL | 1 refills | Status: DC

## 2021-03-04 NOTE — Progress Notes (Signed)
New Patient Office Visit  Subjective:  Patient ID: Misty Bauer, female    DOB: 18-Jul-1985  Age: 36 y.o. MRN: [id number]  CC:  Chief Complaint  Patient presents with   New Patient (Initial Visit)    HPI Misty Bauer presents to establish new primary care provider. Patient has a list of complaints. -constantly anxious. -difficulty sleeping -shakiness -irregular appetite -weight loss despite increased appetite at times.  -difficulty regulating her body temperature.  -had miscarriage in January. This was 2nd miscarriage she had. GYN provider did labs which came back showing TSH of 0.35 T3 2.8 Free T4 1.21 Other labs drawn were within normal limits.  She does have family history of thyroid problems. She does not have personal known history of thyroid disorders.   Past Medical History:  Diagnosis Date   Allergy    Anxiety    Clotting disorder (HCC)    In remission since 2008 - ITP    Depression    GERD (gastroesophageal reflux disease)    occasional - diet controlled - tums prn   History of kidney stones    passes stones, no surgery required   ITP (idiopathic thrombocytopenic purpura)    In remission since 2008   Migraine    last one 02/2016 - weather / smells are triggers    Past Surgical History:  Procedure Laterality Date   DILATION AND EVACUATION N/A 05/03/2016   Procedure: DILATATION AND EVACUATION WITH  ULTRASOUND GUIDANCE ;  Surgeon: Olivia Mackie, MD;  Location: [facility];  Service: Gynecology;  Laterality: N/A;  NEEDS ULTRASOUND    WISDOM TOOTH EXTRACTION  2014    Family History  Problem Relation Age of Onset   Thyroid disease Mother    Hyperlipidemia Maternal Grandmother     Social History   Socioeconomic History   Marital status: Married    Spouse name: Not on file   Number of children: Not on file   Years of education: Not on file   Highest education level: Not on file  Occupational History   Occupation: Mudlogger  Tobacco Use    Smoking status: Every Day    Packs/day: 1.00    Years: 14.00    Pack years: 14.00    Types: Cigarettes   Smokeless tobacco: Never  Vaping Use   Vaping Use: Some days  Substance and Sexual Activity   Alcohol use: Yes    Alcohol/week: 0.0 standard drinks    Comment: Beer/Liquor 4 drinks   Drug use: Yes   Sexual activity: Yes    Partners: Male    Birth control/protection: None    Comment: MAB 02/2016 - still has heavy bleeding  Other Topics Concern   Not on file  Social History Narrative   Not on file   Social Determinants of Health   Financial Resource Strain: Not on file  Food Insecurity: Not on file  Transportation Needs: Not on file  Physical Activity: Not on file  Stress: Not on file  Social Connections: Not on file  Intimate Partner Violence: Not on file    ROS Review of Systems  Constitutional:  Positive for fatigue and unexpected weight change. Negative for activity change, appetite change, chills and fever.       Weight loss despite increased appetite most of the time.   HENT:  Negative for congestion, postnasal drip, rhinorrhea, sinus pressure, sinus pain, sneezing and sore throat.   Eyes: Negative.   Respiratory:  Negative for cough, chest tightness, shortness of breath and  wheezing.   Cardiovascular:  Positive for palpitations. Negative for chest pain.  Gastrointestinal:  Negative for abdominal pain, constipation, diarrhea, nausea and vomiting.  Endocrine: Negative for cold intolerance, heat intolerance, polydipsia and polyuria.  Genitourinary:  Negative for dyspareunia, dysuria, flank pain, frequency and urgency.       Recently had second miscarriage.   Musculoskeletal:  Negative for arthralgias, back pain and myalgias.  Skin:  Negative for rash.  Allergic/Immunologic: Negative for environmental allergies.  Neurological:  Positive for tremors. Negative for dizziness, weakness and headaches.  Hematological:  Negative for adenopathy.  Psychiatric/Behavioral:   The patient is nervous/anxious.    Objective:   Today's Vitals   03/04/21 1035  BP: (!) 142/83  Pulse: 76  Temp: 97.9 F (36.6 C)  SpO2: 100%  Weight: 95 lb 6.4 oz (43.3 kg)   Body mass index is 17.45 kg/m.   Physical Exam Vitals and nursing note reviewed.  Constitutional:      Appearance: Normal appearance. She is well-developed.  HENT:     Head: Normocephalic and atraumatic.     Comments: Single submandibular node palpable. Measures about 1cm in diameter. Slightly tender to palpate.     Nose: Nose normal.     Mouth/Throat:     Mouth: Mucous membranes are moist.     Pharynx: Oropharynx is clear.  Eyes:     Extraocular Movements: Extraocular movements intact.     Conjunctiva/sclera: Conjunctivae normal.     Pupils: Pupils are equal, round, and reactive to light.  Neck:     Thyroid: Thyromegaly present. No thyroid tenderness.  Cardiovascular:     Rate and Rhythm: Normal rate and regular rhythm.     Pulses: Normal pulses.     Heart sounds: Normal heart sounds.  Pulmonary:     Effort: Pulmonary effort is normal.     Breath sounds: Normal breath sounds.  Abdominal:     Palpations: Abdomen is soft.  Musculoskeletal:        General: Normal range of motion.     Cervical back: Normal range of motion and neck supple.  Lymphadenopathy:     Cervical: No cervical adenopathy.  Skin:    General: Skin is warm and dry.     Capillary Refill: Capillary refill takes less than 2 seconds.  Neurological:     General: No focal deficit present.     Mental Status: She is alert and oriented to person, place, and time.  Psychiatric:        Mood and Affect: Mood normal.        Behavior: Behavior normal.        Thought Content: Thought content normal.        Judgment: Judgment normal.    Assessment & Plan:  1. Encounter to establish care Appointment today to establish new primary care provider    2. Hyperthyroidism Start inderal 10mg  up to twice daily, but definitely should take  at night. Will get ultrasound of thyroid. Refer to endocrinology.  - Ambulatory referral to Endocrinology - propranolol (INDERAL) 10 MG tablet; Take 1 tablet (10 mg total) by mouth 2 (two) times daily.  Dispense: 60 tablet; Refill: 1 - THYROID; Future  3. Thyromegaly Thyroid ultrasound ordered today - US THYROID; Future  4. Lymphadenopathy Thyroid ultrasound will hopefully note submandibular node which is enlarged and slightly tender. Refer as indicated.  - US THYROID; Future  5. Essential hypertension Add propranolol. Will have her take at night. May take 2nd time during the  days as needed and as tolerated.  - propranolol (INDERAL) 10 MG tablet; Take 1 tablet (10 mg total) by mouth 2 (two) times daily.  Dispense: 60 tablet; Refill: 1   Problem List Items Addressed This Visit       Cardiovascular and Mediastinum   Essential hypertension   Relevant Medications   propranolol (INDERAL) 10 MG tablet     Endocrine   Hyperthyroidism   Relevant Medications   propranolol (INDERAL) 10 MG tablet   Other Relevant Orders   Ambulatory referral to Endocrinology   US THYROID   Thyromegaly   Relevant Medications   propranolol (INDERAL) 10 MG tablet   Other Relevant Orders   US THYROID     Immune and Lymphatic   Lymphadenopathy   Relevant Orders   US THYROID   Other Visit Diagnoses     Encounter to establish care    -  Primary       Outpatient Encounter Medications as of 03/04/2021  Medication Sig   benzonatate (TESSALON) 100 MG capsule Take 1 capsule (100 mg total) by mouth every 8 (eight) hours.   ondansetron (ZOFRAN) 4 MG tablet Take 1 tablet (4 mg total) by mouth every 6 (six) hours.   propranolol (INDERAL) 10 MG tablet Take 1 tablet (10 mg total) by mouth 2 (two) times daily.   azithromycin (ZITHROMAX) 250 MG tablet Take 1 tablet (250 mg total) by mouth daily. Take first 2 tablets together, then 1 every day until finished. (Patient not taking: Reported on 03/04/2021)    fluticasone (FLONASE) 50 MCG/ACT nasal spray Place 2 sprays into both nostrils daily. (Patient not taking: Reported on 03/04/2021)   No facility-administered encounter medications on file as of 03/04/2021.    Follow-up: Return in about 3 weeks (around 03/25/2021) for blood pressure, thyroid - please get recent records from [facility].   Carlean Jews, NP

## 2021-03-08 DIAGNOSIS — R591 Generalized enlarged lymph nodes: Secondary | ICD-10-CM | POA: Insufficient documentation

## 2021-03-08 DIAGNOSIS — E0590 Thyrotoxicosis, unspecified without thyrotoxic crisis or storm: Secondary | ICD-10-CM | POA: Insufficient documentation

## 2021-03-08 DIAGNOSIS — E010 Iodine-deficiency related diffuse (endemic) goiter: Secondary | ICD-10-CM | POA: Insufficient documentation

## 2021-03-08 DIAGNOSIS — I10 Essential (primary) hypertension: Secondary | ICD-10-CM | POA: Insufficient documentation

## 2021-03-10 ENCOUNTER — Ambulatory Visit
Admission: RE | Admit: 2021-03-10 | Discharge: 2021-03-10 | Disposition: A | Discharge status: Home / Self Care | Payer: BC Managed Care – PPO | Source: Ambulatory Visit | Attending: Nurse Practitioner | Admitting: Nurse Practitioner

## 2021-03-10 DIAGNOSIS — E010 Iodine-deficiency related diffuse (endemic) goiter: Secondary | ICD-10-CM

## 2021-03-10 DIAGNOSIS — E0590 Thyrotoxicosis, unspecified without thyrotoxic crisis or storm: Secondary | ICD-10-CM

## 2021-03-10 DIAGNOSIS — E049 Nontoxic goiter, unspecified: Secondary | ICD-10-CM | POA: Diagnosis not present

## 2021-03-10 DIAGNOSIS — R590 Localized enlarged lymph nodes: Secondary | ICD-10-CM | POA: Diagnosis not present

## 2021-03-10 DIAGNOSIS — R591 Generalized enlarged lymph nodes: Secondary | ICD-10-CM

## 2021-03-21 ENCOUNTER — Ambulatory Visit: Payer: BC Managed Care – PPO | Admitting: Endocrinology

## 2021-03-21 ENCOUNTER — Other Ambulatory Visit: Payer: Self-pay

## 2021-03-21 VITALS — BP 160/110 | HR 84 | Ht 62.0 in | Wt 91.6 lb

## 2021-03-21 DIAGNOSIS — E0590 Thyrotoxicosis, unspecified without thyrotoxic crisis or storm: Secondary | ICD-10-CM

## 2021-03-21 NOTE — Patient Instructions (Addendum)
Your blood pressure is high today.  Please see your primary care provider soon, to have it rechecked.   ?Blood tests are requested for you today.  We'll let you know about the results.  ?Please come back for a follow-up appointment in 4 months.   ?We should recheck the thyroid blood tests sooner if the pregnancy happens, or if you have any change in your symptoms, or new symptoms.   ?

## 2021-03-21 NOTE — Progress Notes (Unsigned)
° °  Subjective:    Patient ID: Misty Bauer, female    DOB: 03-11-1985, 36 y.o.   MRN: [id number]  HPI Pt is referred by for hyperthyroidism.  Pt reports he was dx'ed with hyperthyroidism in 2023.  she has never been on therapy for this.  she has never had XRT to the anterior neck, or thyroid surgery.  she does not consume non-prescribed thyroid medication.  she has never been on amiodarone.  She has sweating, but not heat intolerance.  She has insomnia, tremor, and palpitations.  She is planning to attempt another pregnancy soon.    Review of Systems denies weight loss, sob, muscle weakness, and anxiety.      Objective:   Physical Exam VS: see vs page GEN: no distress HEAD: head: no deformity eyes: no periorbital swelling, no proptosis external nose and ears are normal NECK: supple, thyroid is not enlarged CHEST WALL: no deformity LUNGS: clear to auscultation CV: reg rate and rhythm, no murmur.  MUSCULOSKELETAL: gait is normal and steady EXTEMITIES: no deformity.  no leg edema NEURO:  readily moves all 4's.  sensation is intact to touch on all 4's.  No tremor.   SKIN:  Normal texture and temperature.  No rash or suspicious lesion is visible.  Not diaphoretic NODES:  None palpable at the neck PSYCH: alert, well-oriented.  Does not appear anxious nor depressed.   [place] (2023) Borderline enlarged thyroid gland.   TSH (2/23)=0.35  I have reviewed outside records, and summarized: Pt was noted to have abnormal TFT, and referred here.  She had 2nd miscarriage 1/23, so Dr Billy Coast checked TFT.      Assessment & Plan:

## 2021-03-22 ENCOUNTER — Encounter: Payer: Self-pay | Admitting: Nurse Practitioner

## 2021-03-22 LAB — T4, FREE: Free T4: 0.85 ng/dL (ref 0.60–1.60)

## 2021-03-22 LAB — TSH: TSH: 0.75 u[IU]/mL (ref 0.35–5.50)

## 2021-03-22 NOTE — Telephone Encounter (Signed)
Pt is scheduled for tomorrow 

## 2021-03-23 ENCOUNTER — Ambulatory Visit: Payer: BC Managed Care – PPO | Admitting: Nurse Practitioner

## 2021-03-23 ENCOUNTER — Other Ambulatory Visit: Payer: Self-pay

## 2021-03-23 ENCOUNTER — Ambulatory Visit (INDEPENDENT_AMBULATORY_CARE_PROVIDER_SITE_OTHER): Payer: BC Managed Care – PPO | Admitting: Nurse Practitioner

## 2021-03-23 ENCOUNTER — Encounter: Payer: Self-pay | Admitting: Nurse Practitioner

## 2021-03-23 VITALS — BP 136/89 | HR 97 | Temp 98.2°F | Ht 62.0 in | Wt 92.4 lb

## 2021-03-23 DIAGNOSIS — N760 Acute vaginitis: Secondary | ICD-10-CM

## 2021-03-23 DIAGNOSIS — I10 Essential (primary) hypertension: Secondary | ICD-10-CM | POA: Diagnosis not present

## 2021-03-23 DIAGNOSIS — F439 Reaction to severe stress, unspecified: Secondary | ICD-10-CM

## 2021-03-23 DIAGNOSIS — Z202 Contact with and (suspected) exposure to infections with a predominantly sexual mode of transmission: Secondary | ICD-10-CM | POA: Diagnosis not present

## 2021-03-23 DIAGNOSIS — B9689 Other specified bacterial agents as the cause of diseases classified elsewhere: Secondary | ICD-10-CM | POA: Diagnosis not present

## 2021-03-23 DIAGNOSIS — N946 Dysmenorrhea, unspecified: Secondary | ICD-10-CM | POA: Diagnosis not present

## 2021-03-23 DIAGNOSIS — E0590 Thyrotoxicosis, unspecified without thyrotoxic crisis or storm: Secondary | ICD-10-CM

## 2021-03-23 LAB — POCT URINALYSIS DIP (CLINITEK)
Bilirubin, UA: NEGATIVE
Glucose, UA: NEGATIVE mg/dL
Leukocytes, UA: NEGATIVE
Nitrite, UA: NEGATIVE
POC PROTEIN,UA: NEGATIVE
Spec Grav, UA: >=1.03 — AB (ref 1.010–1.025)
Urobilinogen, UA: 1 E.U./dL
pH, UA: 6.5 (ref 5.0–8.0)

## 2021-03-23 MED ORDER — METRONIDAZOLE 500 MG PO TABS: 500.0000 mg | ORAL_TABLET | Freq: Two times a day (BID) | ORAL | 0 refills | Status: AC

## 2021-03-23 MED ORDER — IBUPROFEN 600 MG PO TABS: 600.0000 mg | ORAL_TABLET | Freq: Three times a day (TID) | ORAL | 1 refills | Status: AC | PRN

## 2021-03-23 NOTE — Progress Notes (Signed)
Established patient visit ? ? ?Patient: Misty Bauer   DOB: 05-23-1985   36 y.o. Female  MRN: [id number] ?Visit Date: 03/23/2021 ? ? ?Chief Complaint  ?Patient presents with  ? Urinary Tract Infection  ? ?Subjective  ?  ?HPI  ?The patient is here for acute visit. Started having changes to her vaginal discharge. Became thick and brownish in color. States that it did have odor to it. Concerned because she did have intercourse with a new partner one time. States that something just hasn't been right since then.  ?-States that she started her menstrual cycle early. Started on Saturday. -Started as light, but is now very heavy and has a lot of clots. Severe cramping.  ?-Had bp elevated at recent visit. Taking inderal 10mg  twice daily. Tolerating well.  ?-Hyperthyroid on labs done outside of office. Has seen endocrinology since her last visit with me. Recheck of labs was normal. Scan did show hyperthyroid. Will see practice again in 4 months.  ?-has some anxiety. In process of separating with her husband. States that she is sleeping better. Will still wake up during the night, but able to fall asleep easier and faster.  ? ? ? ?Medications: ?Outpatient Medications Prior to Visit  ?Medication Sig  ? benzonatate (TESSALON) 100 MG capsule Take 1 capsule (100 mg total) by mouth every 8 (eight) hours.  ? folic acid (FOLVITE) 1 MG tablet Take 4 mg by mouth daily.  ? ondansetron (ZOFRAN) 4 MG tablet Take 1 tablet (4 mg total) by mouth every 6 (six) hours.  ? propranolol (INDERAL) 10 MG tablet Take 1 tablet (10 mg total) by mouth 2 (two) times daily.  ? fluticasone (FLONASE) 50 MCG/ACT nasal spray Place 2 sprays into both nostrils daily. (Patient not taking: Reported on 03/21/2021)  ? ?No facility-administered medications prior to visit.  ? ? ?Review of Systems  ?Constitutional:  Negative for activity change, appetite change, chills, fatigue and fever.  ?     Weight gain one pound since last visit   ?HENT:  Negative for  congestion, postnasal drip, rhinorrhea, sinus pressure, sinus pain, sneezing and sore throat.   ?Eyes: Negative.   ?Respiratory:  Negative for cough, chest tightness, shortness of breath and wheezing.   ?Cardiovascular:  Negative for chest pain and palpitations.  ?     Improved blood pressure  ?Gastrointestinal:  Negative for abdominal pain, constipation, diarrhea, nausea and vomiting.  ?Endocrine: Negative for cold intolerance, heat intolerance, polydipsia and polyuria.  ?     Hyperthyroid and enlarged thyroid. Now seeing endocrionology  ?Genitourinary:  Positive for dysuria, flank pain, frequency, menstrual problem, pelvic pain, vaginal discharge and vaginal pain. Negative for dyspareunia and urgency.  ?Musculoskeletal:  Negative for arthralgias, back pain and myalgias.  ?Skin:  Negative for rash.  ?Allergic/Immunologic: Negative for environmental allergies.  ?Neurological:  Negative for dizziness, weakness and headaches.  ?Hematological:  Negative for adenopathy.  ?Psychiatric/Behavioral:  Positive for dysphoric mood and sleep disturbance. Negative for hallucinations. The patient is nervous/anxious.   ?     Improved mood since her last visit   ? ? ? ? Objective  ?  ? ?Today's Vitals  ? 03/23/21 1421  ?BP: 136/89  ?Pulse: 97  ?Temp: 98.2 ?F (36.8 ?C)  ?SpO2: 100%  ?Weight: 92 lb 6.4 oz (41.9 kg)  ?Height: 5\' 2"  (1.575 m)  ? ?Body mass index is 16.9 kg/m?.  ? ?BP Readings from Last 3 Encounters:  ?03/23/21 136/89  ?03/21/21 (!) 160/110  ?03/04/21 (!) 142/83  ?  ?  Wt Readings from Last 3 Encounters:  ?03/23/21 92 lb 6.4 oz (41.9 kg)  ?03/21/21 91 lb 9.6 oz (41.5 kg)  ?03/04/21 95 lb 6.4 oz (43.3 kg)  ?  ?Physical Exam ?Vitals and nursing note reviewed.  ?Constitutional:   ?   Appearance: Normal appearance. She is well-developed and underweight.  ?HENT:  ?   Head: Normocephalic.  ?   Nose: Nose normal.  ?   Mouth/Throat:  ?   Mouth: Mucous membranes are moist.  ?   Pharynx: Oropharynx is clear.  ?Eyes:  ?    Extraocular Movements: Extraocular movements intact.  ?   Conjunctiva/sclera: Conjunctivae normal.  ?   Pupils: Pupils are equal, round, and reactive to light.  ?Cardiovascular:  ?   Rate and Rhythm: Normal rate and regular rhythm.  ?   Pulses: Normal pulses.  ?   Heart sounds: Normal heart sounds.  ?Pulmonary:  ?   Effort: Pulmonary effort is normal.  ?   Breath sounds: Normal breath sounds.  ?Abdominal:  ?   Palpations: Abdomen is soft.  ?Genitourinary: ?   Comments: Urine sample positive for moderate blood and trace ketones.  ?Musculoskeletal:     ?   General: Normal range of motion.  ?   Cervical back: Normal range of motion and neck supple.  ?Lymphadenopathy:  ?   Cervical: No cervical adenopathy.  ?Skin: ?   General: Skin is warm and dry.  ?   Capillary Refill: Capillary refill takes less than 2 seconds.  ?Neurological:  ?   General: No focal deficit present.  ?   Mental Status: She is alert and oriented to person, place, and time.  ?Psychiatric:     ?   Mood and Affect: Mood normal.     ?   Behavior: Behavior normal. Behavior is cooperative.     ?   Thought Content: Thought content normal.     ?   Judgment: Judgment normal.  ?  ? ?Results for orders placed or performed in visit on 03/23/21  ?POCT URINALYSIS DIP (CLINITEK)  ?Result Value Ref Range  ? Color, UA yellow yellow  ? Clarity, UA clear clear  ? Glucose, UA negative negative mg/dL  ? Bilirubin, UA negative negative  ? Ketones, POC UA trace (5) (A) negative mg/dL  ? Spec Grav, UA >=1.030 (A) 1.010 - 1.025  ? Blood, UA moderate (A) negative  ? pH, UA 6.5 5.0 - 8.0  ? POC PROTEIN,UA negative negative, trace  ? Urobilinogen, UA 1.0 0.2 or 1.0 E.U./dL  ? Nitrite, UA Negative Negative  ? Leukocytes, UA Negative Negative  ? ? Assessment & Plan  ?  ?1. Bacterial vaginitis ?Start flagyl 500mg  twice daily. Encouraged her to take with food. Advised not to drink alcohol while taking this medication. She voiced understanding.  ?- metroNIDAZOLE (FLAGYL) 500 MG  tablet; Take 1 tablet (500 mg total) by mouth 2 (two) times daily.  Dispense: 14 tablet; Refill: 0 ? ?2. Exposure to STD ?Send urine to check for gonorrhea, chlamydia, and trichomonas. Will adjust antibiotic treatment as indicated  ?- POCT URINALYSIS DIP (CLINITEK) ?- GC/Chlamydia Probe Amp; Future ?- Trichomonas vaginalis, RNA; Future ?- Trichomonas vaginalis, RNA ?- GC/Chlamydia Probe Amp ? ?3. Menstrual cramps ?Ibuprofen 800mg  may be taken up to three times daily as needed for pain ?- ibuprofen (ADVIL) 600 MG tablet; Take 1 tablet (600 mg total) by mouth every 8 (eight) hours as needed.  Dispense: 60 tablet; Refill: 1 ? ?4. Essential  hypertension ?Improved. Continue propranolol 10mg  twice daily. Patient to notify office if starts to run 140/80 or higher.  ? ?5. Hyperthyroidism ?Now seeing endocrinology  ? ?6. Situational stress ?Improving. Will continue to monitor.  ? ?  ?Problem List Items Addressed This Visit   ? ?  ? Cardiovascular and Mediastinum  ? Essential hypertension  ?  ? Endocrine  ? Hyperthyroidism  ?  ? Genitourinary  ? Bacterial vaginitis - Primary  ? Relevant Medications  ? metroNIDAZOLE (FLAGYL) 500 MG tablet  ?  ? Other  ? Menstrual cramps  ? Relevant Medications  ? ibuprofen (ADVIL) 600 MG tablet  ? Situational stress  ? ?Other Visit Diagnoses   ? ? Exposure to STD      ? Relevant Orders  ? POCT URINALYSIS DIP (CLINITEK) (Completed)  ? GC/Chlamydia Probe Amp  ? Trichomonas vaginalis, RNA  ? ?  ?  ? ?Return in about 4 months (around 07/23/2021) for blood pressure - ok to cancel appointment for tomorrow .  ?   ? ? ? ? ?07/25/2021, NP  ?[place] [place] [facility] ?[phone] (phone) ?[phone] (fax) ? ?[facility]  ?

## 2021-03-24 ENCOUNTER — Ambulatory Visit: Payer: BC Managed Care – PPO | Admitting: Nurse Practitioner

## 2021-03-25 ENCOUNTER — Ambulatory Visit: Payer: BC Managed Care – PPO | Admitting: Nurse Practitioner

## 2021-03-25 ENCOUNTER — Encounter: Payer: Self-pay | Admitting: Nurse Practitioner

## 2021-03-25 LAB — GC/CHLAMYDIA PROBE AMP
Chlamydia trachomatis, NAA: NEGATIVE
Neisseria Gonorrhoeae by PCR: NEGATIVE

## 2021-03-25 LAB — TRICHOMONAS VAGINALIS, PROBE AMP: Trich vag by NAA: NEGATIVE

## 2021-03-26 ENCOUNTER — Other Ambulatory Visit: Payer: Self-pay | Admitting: Nurse Practitioner

## 2021-03-26 DIAGNOSIS — I10 Essential (primary) hypertension: Secondary | ICD-10-CM

## 2021-03-26 DIAGNOSIS — E0590 Thyrotoxicosis, unspecified without thyrotoxic crisis or storm: Secondary | ICD-10-CM

## 2021-04-27 ENCOUNTER — Other Ambulatory Visit: Payer: Self-pay | Admitting: Nurse Practitioner

## 2021-04-27 ENCOUNTER — Telehealth: Payer: Self-pay | Admitting: Nurse Practitioner

## 2021-04-27 DIAGNOSIS — E0590 Thyrotoxicosis, unspecified without thyrotoxic crisis or storm: Secondary | ICD-10-CM

## 2021-04-27 DIAGNOSIS — I10 Essential (primary) hypertension: Secondary | ICD-10-CM

## 2021-04-27 NOTE — Telephone Encounter (Signed)
Patient is asking if there is anything stronger than Flonase because she can hardly function with her allergies being so severe right now. Please advise. [phone] ?

## 2021-04-29 NOTE — Telephone Encounter (Signed)
Called pt she is advised to take OTC medication ?

## 2021-04-29 NOTE — Telephone Encounter (Signed)
Please let the patient know that with pollen being so bad this year, she may need to take something oral such as claritin D or Zyrtec D. She can also try using nasonex nasal spray. This is over the counter also. Thanks so much.   -HB

## 2021-05-11 ENCOUNTER — Other Ambulatory Visit: Payer: Self-pay | Admitting: Nurse Practitioner

## 2021-05-11 DIAGNOSIS — I10 Essential (primary) hypertension: Secondary | ICD-10-CM

## 2021-05-11 DIAGNOSIS — E0590 Thyrotoxicosis, unspecified without thyrotoxic crisis or storm: Secondary | ICD-10-CM

## 2021-07-19 ENCOUNTER — Telehealth: Payer: Self-pay

## 2021-07-19 ENCOUNTER — Ambulatory Visit: Payer: BC Managed Care – PPO | Admitting: Nurse Practitioner

## 2021-07-19 NOTE — Telephone Encounter (Signed)
Attempted to contact the patient in regards to rescheduling with another provider, mail box full.

## 2021-08-22 ENCOUNTER — Other Ambulatory Visit: Payer: Self-pay | Admitting: Physician Assistant

## 2021-08-22 DIAGNOSIS — E0590 Thyrotoxicosis, unspecified without thyrotoxic crisis or storm: Secondary | ICD-10-CM

## 2021-08-22 DIAGNOSIS — I10 Essential (primary) hypertension: Secondary | ICD-10-CM
